# Patient Record
Sex: Female | Born: 1965
Health system: Southern US, Community
[De-identification: ages and names within clinical notes are randomized; demographics above are authoritative.]

## PROBLEM LIST (undated history)

## (undated) DIAGNOSIS — Z9221 Personal history of antineoplastic chemotherapy: Secondary | ICD-10-CM

## (undated) DIAGNOSIS — T66XXXA Radiation sickness, unspecified, initial encounter: Secondary | ICD-10-CM

## (undated) DIAGNOSIS — C50919 Malignant neoplasm of unspecified site of unspecified female breast: Principal | ICD-10-CM

## (undated) DIAGNOSIS — I10 Essential (primary) hypertension: Secondary | ICD-10-CM

## (undated) DIAGNOSIS — Z923 Personal history of irradiation: Secondary | ICD-10-CM

## (undated) DIAGNOSIS — C801 Malignant (primary) neoplasm, unspecified: Secondary | ICD-10-CM

## (undated) HISTORY — PX: LIPOMA EXCISION: SHX5283

## (undated) HISTORY — PX: BREAST BIOPSY: SHX20

## (undated) HISTORY — PX: DILATION AND CURETTAGE OF UTERUS: SHX78

## (undated) HISTORY — PX: PORTACATH PLACEMENT: SHX2246

## (undated) HISTORY — PX: OTHER SURGICAL HISTORY: SHX169

## (undated) HISTORY — DX: Malignant neoplasm of unspecified site of unspecified female breast: C50.919

## (undated) HISTORY — PX: MASTECTOMY PARTIAL / LUMPECTOMY: SUR851

## (undated) HISTORY — PX: MASTECTOMY: SHX3

## (undated) HISTORY — DX: Radiation sickness, unspecified, initial encounter: T66.XXXA

## (undated) HISTORY — DX: Malignant (primary) neoplasm, unspecified: C80.1

## (undated) HISTORY — PX: AXILLARY LYMPH NODE DISSECTION: SHX5229

## (undated) HISTORY — PX: LYMPH NODE DISSECTION: SHX5087

---

## 2008-04-05 ENCOUNTER — Ambulatory Visit (HOSPITAL_COMMUNITY): Admission: RE | Admit: 2008-04-05 | Discharge: 2008-04-05 | Payer: Self-pay | Admitting: Internal Medicine

## 2008-10-25 ENCOUNTER — Ambulatory Visit: Payer: Self-pay | Admitting: Orthopedic Surgery

## 2008-10-25 DIAGNOSIS — S93609A Unspecified sprain of unspecified foot, initial encounter: Secondary | ICD-10-CM | POA: Insufficient documentation

## 2008-10-26 ENCOUNTER — Encounter: Payer: Self-pay | Admitting: Orthopedic Surgery

## 2008-10-31 ENCOUNTER — Encounter: Admission: RE | Admit: 2008-10-31 | Discharge: 2008-10-31 | Payer: Self-pay | Admitting: Specialist

## 2008-11-09 ENCOUNTER — Ambulatory Visit: Payer: Self-pay | Admitting: Orthopedic Surgery

## 2009-04-13 ENCOUNTER — Ambulatory Visit (HOSPITAL_COMMUNITY): Admission: RE | Admit: 2009-04-13 | Discharge: 2009-04-13 | Payer: Self-pay | Admitting: Internal Medicine

## 2009-04-25 ENCOUNTER — Ambulatory Visit (HOSPITAL_COMMUNITY): Admission: RE | Admit: 2009-04-25 | Discharge: 2009-04-25 | Payer: Self-pay | Admitting: Internal Medicine

## 2009-05-25 ENCOUNTER — Encounter: Admission: RE | Admit: 2009-05-25 | Discharge: 2009-05-25 | Payer: Self-pay | Admitting: Obstetrics & Gynecology

## 2011-02-27 ENCOUNTER — Other Ambulatory Visit: Payer: Self-pay | Admitting: Internal Medicine

## 2011-02-27 DIAGNOSIS — N6324 Unspecified lump in the left breast, lower inner quadrant: Secondary | ICD-10-CM

## 2011-03-11 ENCOUNTER — Ambulatory Visit
Admission: RE | Admit: 2011-03-11 | Discharge: 2011-03-11 | Disposition: A | Discharge status: Home / Self Care | Payer: Self-pay | Source: Ambulatory Visit | Attending: Internal Medicine | Admitting: Internal Medicine

## 2011-03-11 ENCOUNTER — Other Ambulatory Visit: Payer: Self-pay | Admitting: Internal Medicine

## 2011-03-11 ENCOUNTER — Other Ambulatory Visit: Payer: Self-pay | Admitting: Diagnostic Radiology

## 2011-03-11 DIAGNOSIS — N6324 Unspecified lump in the left breast, lower inner quadrant: Secondary | ICD-10-CM

## 2011-03-11 DIAGNOSIS — N6320 Unspecified lump in the left breast, unspecified quadrant: Secondary | ICD-10-CM

## 2011-03-12 ENCOUNTER — Other Ambulatory Visit: Payer: Self-pay | Admitting: Internal Medicine

## 2011-03-12 DIAGNOSIS — C50912 Malignant neoplasm of unspecified site of left female breast: Secondary | ICD-10-CM

## 2011-03-15 ENCOUNTER — Ambulatory Visit
Admission: RE | Admit: 2011-03-15 | Discharge: 2011-03-15 | Disposition: A | Discharge status: Home / Self Care | Payer: BC Managed Care – PPO | Source: Ambulatory Visit | Attending: Internal Medicine | Admitting: Internal Medicine

## 2011-03-15 DIAGNOSIS — C50912 Malignant neoplasm of unspecified site of left female breast: Secondary | ICD-10-CM

## 2011-03-20 ENCOUNTER — Ambulatory Visit
Admission: RE | Admit: 2011-03-20 | Discharge: 2011-03-20 | Disposition: A | Discharge status: Home / Self Care | Payer: BC Managed Care – PPO | Source: Ambulatory Visit | Attending: General Surgery | Admitting: General Surgery

## 2011-03-20 ENCOUNTER — Encounter (HOSPITAL_BASED_OUTPATIENT_CLINIC_OR_DEPARTMENT_OTHER): Payer: BC Managed Care – PPO | Admitting: Oncology

## 2011-03-20 ENCOUNTER — Other Ambulatory Visit: Payer: Self-pay | Admitting: General Surgery

## 2011-03-20 ENCOUNTER — Other Ambulatory Visit: Payer: Self-pay | Admitting: Oncology

## 2011-03-20 DIAGNOSIS — N6320 Unspecified lump in the left breast, unspecified quadrant: Secondary | ICD-10-CM

## 2011-03-20 DIAGNOSIS — C773 Secondary and unspecified malignant neoplasm of axilla and upper limb lymph nodes: Secondary | ICD-10-CM

## 2011-03-20 DIAGNOSIS — I10 Essential (primary) hypertension: Secondary | ICD-10-CM

## 2011-03-20 DIAGNOSIS — C50019 Malignant neoplasm of nipple and areola, unspecified female breast: Secondary | ICD-10-CM

## 2011-03-20 DIAGNOSIS — F411 Generalized anxiety disorder: Secondary | ICD-10-CM

## 2011-03-20 DIAGNOSIS — C50919 Malignant neoplasm of unspecified site of unspecified female breast: Secondary | ICD-10-CM

## 2011-03-20 LAB — CANCER ANTIGEN 27.29: CA 27.29: 23 U/mL (ref 0–39)

## 2011-03-20 LAB — CBC WITH DIFFERENTIAL/PLATELET
BASO%: 0.7 % (ref 0.0–2.0)
EOS%: 1.7 % (ref 0.0–7.0)
Eosinophils Absolute: 0.1 10*3/uL (ref 0.0–0.5)
HCT: 41.6 % (ref 34.8–46.6)
HGB: 14.4 g/dL (ref 11.6–15.9)
MCH: 32.1 pg (ref 25.1–34.0)
MCV: 92.7 fL (ref 79.5–101.0)
NEUT%: 60.5 % (ref 38.4–76.8)
WBC: 5.3 10*3/uL (ref 3.9–10.3)
lymph#: 1.6 10*3/uL (ref 0.9–3.3)

## 2011-03-20 LAB — COMPREHENSIVE METABOLIC PANEL
Albumin: 4.5 g/dL (ref 3.5–5.2)
Alkaline Phosphatase: 49 U/L (ref 39–117)
Calcium: 9.2 mg/dL (ref 8.4–10.5)
Glucose, Bld: 98 mg/dL (ref 70–99)
Potassium: 3.7 mEq/L (ref 3.5–5.3)
Total Bilirubin: 0.7 mg/dL (ref 0.3–1.2)

## 2011-03-22 ENCOUNTER — Encounter: Payer: BC Managed Care – PPO | Admitting: Genetic Counselor

## 2011-03-26 ENCOUNTER — Other Ambulatory Visit (HOSPITAL_COMMUNITY): Payer: Self-pay | Admitting: General Surgery

## 2011-03-26 DIAGNOSIS — C50912 Malignant neoplasm of unspecified site of left female breast: Secondary | ICD-10-CM

## 2011-04-05 ENCOUNTER — Encounter (HOSPITAL_BASED_OUTPATIENT_CLINIC_OR_DEPARTMENT_OTHER)
Admission: RE | Admit: 2011-04-05 | Discharge: 2011-04-05 | Disposition: A | Discharge status: Home / Self Care | Payer: BC Managed Care – PPO | Source: Ambulatory Visit | Attending: General Surgery | Admitting: General Surgery

## 2011-04-05 ENCOUNTER — Ambulatory Visit
Admission: RE | Admit: 2011-04-05 | Discharge: 2011-04-05 | Disposition: A | Discharge status: Home / Self Care | Payer: BC Managed Care – PPO | Source: Ambulatory Visit | Attending: General Surgery | Admitting: General Surgery

## 2011-04-05 ENCOUNTER — Other Ambulatory Visit: Payer: Self-pay | Admitting: General Surgery

## 2011-04-05 DIAGNOSIS — Z01811 Encounter for preprocedural respiratory examination: Secondary | ICD-10-CM

## 2011-04-05 LAB — HEPATIC FUNCTION PANEL
ALT: 33 U/L (ref 0–35)
AST: 35 U/L (ref 0–37)
Albumin: 4.2 g/dL (ref 3.5–5.2)
Alkaline Phosphatase: 43 U/L (ref 39–117)
Indirect Bilirubin: 0.5 mg/dL (ref 0.3–0.9)
Total Protein: 7.1 g/dL (ref 6.0–8.3)

## 2011-04-05 LAB — BASIC METABOLIC PANEL
CO2: 31 mEq/L (ref 19–32)
Chloride: 102 mEq/L (ref 96–112)
Potassium: 4.3 mEq/L (ref 3.5–5.1)

## 2011-04-09 ENCOUNTER — Ambulatory Visit (HOSPITAL_COMMUNITY)
Admission: RE | Admit: 2011-04-09 | Discharge: 2011-04-09 | Disposition: A | Discharge status: Home / Self Care | Payer: BC Managed Care – PPO | Source: Ambulatory Visit | Attending: General Surgery | Admitting: General Surgery

## 2011-04-09 ENCOUNTER — Other Ambulatory Visit (HOSPITAL_COMMUNITY): Payer: BC Managed Care – PPO

## 2011-04-09 ENCOUNTER — Other Ambulatory Visit: Payer: Self-pay | Admitting: General Surgery

## 2011-04-09 ENCOUNTER — Ambulatory Visit (HOSPITAL_BASED_OUTPATIENT_CLINIC_OR_DEPARTMENT_OTHER)
Admission: RE | Admit: 2011-04-09 | Discharge: 2011-04-09 | Disposition: A | Discharge status: Home / Self Care | Payer: BC Managed Care – PPO | Source: Ambulatory Visit | Attending: General Surgery | Admitting: General Surgery

## 2011-04-09 DIAGNOSIS — I10 Essential (primary) hypertension: Secondary | ICD-10-CM | POA: Insufficient documentation

## 2011-04-09 DIAGNOSIS — F411 Generalized anxiety disorder: Secondary | ICD-10-CM | POA: Insufficient documentation

## 2011-04-09 DIAGNOSIS — C50119 Malignant neoplasm of central portion of unspecified female breast: Secondary | ICD-10-CM | POA: Insufficient documentation

## 2011-04-09 DIAGNOSIS — Z01818 Encounter for other preprocedural examination: Secondary | ICD-10-CM | POA: Insufficient documentation

## 2011-04-09 DIAGNOSIS — M549 Dorsalgia, unspecified: Secondary | ICD-10-CM | POA: Insufficient documentation

## 2011-04-09 DIAGNOSIS — N63 Unspecified lump in unspecified breast: Secondary | ICD-10-CM | POA: Insufficient documentation

## 2011-04-09 DIAGNOSIS — C773 Secondary and unspecified malignant neoplasm of axilla and upper limb lymph nodes: Secondary | ICD-10-CM | POA: Insufficient documentation

## 2011-04-09 DIAGNOSIS — C50912 Malignant neoplasm of unspecified site of left female breast: Secondary | ICD-10-CM

## 2011-04-09 DIAGNOSIS — D0590 Unspecified type of carcinoma in situ of unspecified breast: Secondary | ICD-10-CM | POA: Insufficient documentation

## 2011-04-09 DIAGNOSIS — Z170 Estrogen receptor positive status [ER+]: Secondary | ICD-10-CM | POA: Insufficient documentation

## 2011-04-09 DIAGNOSIS — C50919 Malignant neoplasm of unspecified site of unspecified female breast: Secondary | ICD-10-CM | POA: Insufficient documentation

## 2011-04-09 MED ORDER — TECHNETIUM TC 99M SULFUR COLLOID FILTERED
1.0000 | Freq: Once | INTRAVENOUS | Status: AC | PRN
  Administered 2011-04-09: 1 via INTRADERMAL

## 2011-04-11 NOTE — Op Note (Signed)
[name], [name]           ACCOUNT NO.:  [id number]  MEDICAL RECORD NO.:  [id number]           PATIENT TYPE:  O  LOCATION:  NUC                          FACILITY:  [facility]  PHYSICIAN:  Adolph Pollack, M.D.DATE OF BIRTH:  1966-03-12  DATE OF PROCEDURE: DATE OF DISCHARGE:                              OPERATIVE REPORT   PREOPERATIVE DIAGNOSES: 1. Left breast cancer. 2. Left breast mass.  POSTOPERATIVE DIAGNOSES: 1. Left breast cancer. 2. Left breast mass.  PROCEDURES: 1. Injection of blue dye in the left breast and axillary mapping. 2. Left axillary sentinel lymph node biopsy (4 lymph nodes). 3. Excision of left breast mass upper outer quadrant. 4. Left partial mastectomy (central).  SURGEON:  Adolph Pollack, MD  ANESTHESIA:  General.  INDICATIONS:  [name] is a 45 year old female who noted some left nipple inversion and thought she had a mass behind her nipple. This is an abnormal area which was confirmed mammographically and she underwent an image-guided biopsy showing invasive lobular carcinoma, ER/PR positive.  Bilateral breast MRI was performed which showed the enhancing mass measuring about 2.8 x 1.8 x 1.6 cm.  She also had a palpable mass in the upper outer quadrant that did not show up on ultrasound imaging.  She now presents for the above procedures.  We discussed the procedure risks and aftercare preoperatively.  TECHNIQUE:  She underwent a successful injection of radioactive material in the circumareolar area.  I then saw her in the holding area and marked the left breast mass and her left breast with my initials.  She was brought to the operating room, placed supine on the operating room table and general anesthetic was administered.  I then sterilely prepped the circumareolar area and injected blue dye at 12, 3, 6,  and 9 o'clock quadrants to massage the breast for 5 minutes.  The left breast and axilla were then sterilely prepped and  draped.  I then identified a point in the inferior axillary line which had increased counts by way of the NeoProbe.  A transverse incision was made in the inferior axillary line on the left side and subcutaneous tissue was divided with electrocautery.  Using a NeoProbe, I identified 2 lymph nodes that were directly adjacent to each other, both of which were slightly blue.  Both had significantly increased counts and these were excised and sent as sentinel lymph node 1 and 2.  The reduction of the NeoProbe back into the room demonstrated an area of increased counts and I was able to identify lymph node that was not blue but had increased counts and this was removed as sentinel node 3.  I then reintroduced the NeoProbe and in the subpectoral area (Rotter's area).  There was increased counts in the small palpable lymph which was removed as a sentinel lymph node #4.  No other hot spots were noted.  These were all sent to Pathology.  A lymph node that was not blue or hot was noted adjacent to the other lymph nodes and was removed and sent separately.  Following this, hemostasis was noted to be adequate.  I then closed subcutaneous tissue loosely with  interrupted 3-0 Vicryl sutures.  The skin was closed with 4-0 Monocryl subcuticular stitch.  I then made a small incision in the upper outer quadrant of the left breast and excised the left breast mass which appears to be mostly breast tissue and fatty tissue.  This was sent to Pathology.  The bleeding was controlled with electrocautery.  Subcutaneous tissue was closed with interrupted 3-0 Vicryl sutures and the skin closed with 4-0 Monocryl subcuticular stitch.  Next, I approached the central region and made an elliptical incision around the nipple and raised skin flaps in all directions.  I then took the incision directly into the chest wall, noticed inferiorly, there were some sclerotic areas, so I went a little bit more inferiorly with my  dissection from the skin flap down to the chest wall and extracted, then excised the central breast area off of the fascia of the pectoralis muscle with electrocautery.  I then oriented it with a single suture superiorly and a double suture medially.  This was sent to Pathology. Then irrigated this wound and controlled bleeding with electrocautery. I then mobilized the breast tissue free from the chest wall and loosely approximated to form a breast mound.  We entered with 3-0 Vicryl sutures.  I then reapproximated more superficial subcutaneous tissue with 3-0 Vicryl suture.  The skin was closed with 4-0 Monocryl subcuticular stitch.  Steri-Strips and sterile dressings were applied to all wounds.  She tolerated both procedures well without any apparent complications. She had a breast binder placed on and she was taken to the recovery room in satisfactory condition.     Adolph Pollack, M.D.     [name]  D:  04/09/2011  T:  04/10/2011  Job:  [id number]  cc:   Lurline Hare, M.D. Drue Second, M.D. Catalina Pizza, M.D.  Electronically Signed by Avel Peace M.D. on 04/11/2011 10:41:16 AM

## 2011-04-17 ENCOUNTER — Encounter: Payer: BC Managed Care – PPO | Admitting: Oncology

## 2011-04-19 ENCOUNTER — Other Ambulatory Visit: Payer: Self-pay | Admitting: General Surgery

## 2011-04-19 ENCOUNTER — Encounter (HOSPITAL_COMMUNITY): Payer: BC Managed Care – PPO

## 2011-04-19 DIAGNOSIS — C773 Secondary and unspecified malignant neoplasm of axilla and upper limb lymph nodes: Secondary | ICD-10-CM | POA: Insufficient documentation

## 2011-04-19 DIAGNOSIS — Z01812 Encounter for preprocedural laboratory examination: Secondary | ICD-10-CM | POA: Insufficient documentation

## 2011-04-19 DIAGNOSIS — C50919 Malignant neoplasm of unspecified site of unspecified female breast: Secondary | ICD-10-CM | POA: Insufficient documentation

## 2011-04-19 DIAGNOSIS — I10 Essential (primary) hypertension: Secondary | ICD-10-CM | POA: Insufficient documentation

## 2011-04-19 DIAGNOSIS — Z79899 Other long term (current) drug therapy: Secondary | ICD-10-CM | POA: Insufficient documentation

## 2011-04-19 LAB — SURGICAL PCR SCREEN: MRSA, PCR: NEGATIVE

## 2011-04-23 ENCOUNTER — Ambulatory Visit (HOSPITAL_COMMUNITY)
Admission: RE | Admit: 2011-04-23 | Discharge: 2011-04-24 | Disposition: A | Discharge status: Home / Self Care | Payer: BC Managed Care – PPO | Source: Ambulatory Visit | Attending: General Surgery | Admitting: General Surgery

## 2011-04-23 ENCOUNTER — Ambulatory Visit (HOSPITAL_COMMUNITY): Payer: BC Managed Care – PPO

## 2011-04-23 ENCOUNTER — Other Ambulatory Visit: Payer: Self-pay | Admitting: General Surgery

## 2011-04-23 DIAGNOSIS — C50919 Malignant neoplasm of unspecified site of unspecified female breast: Secondary | ICD-10-CM | POA: Insufficient documentation

## 2011-04-23 DIAGNOSIS — Z91040 Latex allergy status: Secondary | ICD-10-CM | POA: Insufficient documentation

## 2011-04-23 DIAGNOSIS — Z01812 Encounter for preprocedural laboratory examination: Secondary | ICD-10-CM | POA: Insufficient documentation

## 2011-04-23 DIAGNOSIS — Z01818 Encounter for other preprocedural examination: Secondary | ICD-10-CM | POA: Insufficient documentation

## 2011-04-23 DIAGNOSIS — C773 Secondary and unspecified malignant neoplasm of axilla and upper limb lymph nodes: Secondary | ICD-10-CM | POA: Insufficient documentation

## 2011-04-23 DIAGNOSIS — I10 Essential (primary) hypertension: Secondary | ICD-10-CM | POA: Insufficient documentation

## 2011-04-24 NOTE — Op Note (Signed)
[name], [name]           ACCOUNT NO.:  [id number]  MEDICAL RECORD NO.:  [id number]           PATIENT TYPE:  O  LOCATION:  1535                         FACILITY:  [facility]  PHYSICIAN:  Adolph Pollack, M.D.DATE OF BIRTH:  May 23, 1966  DATE OF PROCEDURE:  04/23/2011 DATE OF DISCHARGE:                              OPERATIVE REPORT   PREOPERATIVE DIAGNOSIS:  Left breast cancer, metastatic to left axillary sentinel lymph nodes.  POSTOPERATIVE DIAGNOSIS:  Left breast cancer, metastatic to left axillary sentinel lymph nodes.  PROCEDURE: 1. Left axillary lymph node dissection. 2. Ultrasound-guided Port-A-Cath insertion into right internal jugular     vein with fluoroscopy (8-[place]).  SURGEON:  Adolph Pollack, MD  ANESTHESIA:  General plus local.  INDICATION:  [name] is a 45 year old female who underwent a central lumpectomy and sentinel lymph node biopsy.  Two of the sentinel lymph nodes came back grossly positive as well as 3 being negative. Because of the type of tumor, her age, it was felt she would benefit from left axillary lymph node dissection and now she presents for that. Procedure risks and aftercare were explained to her and her husband.  TECHNIQUE:  She was brought to the operating room, placed supine on the operating table, and general anesthetic was administered.  A left lateral chest axillary area on arm up to the elbow was sterilely prepped and draped.  The previous incision was identified, the skin sutures removed, the subcutaneous sutures were removed, and allowed me to enter the axillary cavity.  Using blunt dissection, I identified the left axillary vein.  I then used blunt dissection to identify the thoracodorsal nerve and the long thoracic nerve.  I then dissected the lymphatic packet of tissue in between these 2 nerves using blunt dissection and a sharp dissection electrocautery.  I clipped and divided lymphatics and small  vessels.  I tested the nerves during the dissection and they remained intact and functional.  I then removed this tissue and then picked out some more of the tissue between these nerves and sent all this as left axillary content to Pathology.  A number of palpable lymph nodes were noted.  Nerve function was tested and was intact.  Following this, I injected Marcaine into the wound and inspected for hemostasis which was adequate.  I then made a stab incision inferior to the wound and brought a 19 Blake drain into the left axilla and anchored it to the skin with a nylon suture.  I then injected local anesthetic into the subdermal area.  The subcutaneous tissue was then closed with interrupted 3-0 Vicryl sutures.  The skin was closed with 4-0 Monocryl subcuticular stitch.  Needle, sponge, and instrument counts were correct.  Steri-Strips and sterile dressing were applied.  Following this, we then removed the drapes.  Using ultrasound, I mapped out the right internal jugular vein. The right neck and upper chest wall then sterilely prepped and draped.  Using the ultrasound, I identified the right internal jugular vein.  A small incision was made in the left neck area and the 16 gauge needle was introduced into the left internal jugular vein under  ultrasound guidance and blood aspirated easily.  I then threaded a wire through the needle, confirming its position also in the left internal jugular vein with ultrasound.  I then used fluoroscopy to confirm its position in the superior vena cava.  Following this, I then injected local anesthetic in the right upper chest wall and made an incision in the right upper chest wall through the skin and subcutaneous tissue.  Using electrocautery, I created a pocket for the port.  I then anesthetized a subcutaneous tunnel between the neck and the chest wall incisions and I tunneled the catheter from the chest wall incision up to the neck incision.  The  dilator introducer device was then placed over the needle into the superior vena cava and verified by fluoroscopy.  The dilator and the needle were removed and the catheter was threaded through the peel-away sheath introducer into the right heart.  The peel-away sheath and this was removed.  Under fluoroscopy, I then pulled back the catheter, the tip was in the distal superior vena cava.  I then cut the catheter and attached to the port at the chest wall incision site.  The port withdrew blood and flushed easily.  The port was then anchored to the chest wall with interrupted 2- 0 Vicryl suture.  Concentrated solution was placed in the port.  I then closed the skin incision of the neck with 4-0 Monocryl subcuticular stitch.  The port site wound in the chest was closed in 2 layers.  Subcutaneous tissue was approximated with running 3-0 Vicryl suture.  The skin was closed with 4-0 Monocryl subcuticular stitch. Steri-Strips and sterile dressings were then applied to this area.  She tolerated both procedures well without any apparent complications and was taken to the Recovery in satisfactory condition where a portable chest x-ray is pending.     Adolph Pollack, M.D.     [name]  D:  04/23/2011  T:  04/24/2011  Job:  [id number]  cc:   Catalina Pizza, M.D. Fax: [phone]  Drue Second, M.D. Fax: [phone]  Electronically Signed by Avel Peace M.D. on 04/24/2011 10:41:59 AM

## 2011-05-23 ENCOUNTER — Encounter (HOSPITAL_BASED_OUTPATIENT_CLINIC_OR_DEPARTMENT_OTHER): Payer: BC Managed Care – PPO | Admitting: Oncology

## 2011-05-23 ENCOUNTER — Other Ambulatory Visit: Payer: Self-pay | Admitting: Oncology

## 2011-05-23 DIAGNOSIS — C50119 Malignant neoplasm of central portion of unspecified female breast: Secondary | ICD-10-CM

## 2011-05-23 DIAGNOSIS — C773 Secondary and unspecified malignant neoplasm of axilla and upper limb lymph nodes: Secondary | ICD-10-CM

## 2011-05-23 DIAGNOSIS — C50019 Malignant neoplasm of nipple and areola, unspecified female breast: Secondary | ICD-10-CM

## 2011-05-23 DIAGNOSIS — C50919 Malignant neoplasm of unspecified site of unspecified female breast: Secondary | ICD-10-CM

## 2011-05-23 DIAGNOSIS — Z170 Estrogen receptor positive status [ER+]: Secondary | ICD-10-CM

## 2011-05-23 LAB — CBC WITH DIFFERENTIAL/PLATELET
Eosinophils Absolute: 0.1 10*3/uL (ref 0.0–0.5)
HCT: 39.4 % (ref 34.8–46.6)
HGB: 13.6 g/dL (ref 11.6–15.9)
LYMPH%: 36.1 % (ref 14.0–49.7)
MONO#: 0.3 10*3/uL (ref 0.1–0.9)
NEUT#: 2.9 10*3/uL (ref 1.5–6.5)
NEUT%: 55.1 % (ref 38.4–76.8)
Platelets: 295 10*3/uL (ref 145–400)
WBC: 5.2 10*3/uL (ref 3.9–10.3)
lymph#: 1.9 10*3/uL (ref 0.9–3.3)

## 2011-05-23 LAB — COMPREHENSIVE METABOLIC PANEL
ALT: 19 U/L (ref 0–35)
CO2: 28 mEq/L (ref 19–32)
Calcium: 9.8 mg/dL (ref 8.4–10.5)
Chloride: 98 mEq/L (ref 96–112)
Creatinine, Ser: 0.81 mg/dL (ref 0.50–1.10)
Glucose, Bld: 112 mg/dL — ABNORMAL HIGH (ref 70–99)
Total Bilirubin: 0.5 mg/dL (ref 0.3–1.2)
Total Protein: 7.5 g/dL (ref 6.0–8.3)

## 2011-05-30 ENCOUNTER — Ambulatory Visit (HOSPITAL_COMMUNITY)
Admission: RE | Admit: 2011-05-30 | Discharge: 2011-05-30 | Disposition: A | Discharge status: Home / Self Care | Payer: BC Managed Care – PPO | Source: Ambulatory Visit | Attending: Oncology | Admitting: Oncology

## 2011-05-30 DIAGNOSIS — Z01818 Encounter for other preprocedural examination: Secondary | ICD-10-CM | POA: Insufficient documentation

## 2011-05-30 DIAGNOSIS — Z5111 Encounter for antineoplastic chemotherapy: Secondary | ICD-10-CM

## 2011-05-30 DIAGNOSIS — C50919 Malignant neoplasm of unspecified site of unspecified female breast: Secondary | ICD-10-CM | POA: Insufficient documentation

## 2011-06-04 ENCOUNTER — Encounter (INDEPENDENT_AMBULATORY_CARE_PROVIDER_SITE_OTHER): Payer: Self-pay | Admitting: General Surgery

## 2011-06-04 ENCOUNTER — Other Ambulatory Visit: Payer: Self-pay | Admitting: Oncology

## 2011-06-04 ENCOUNTER — Encounter (HOSPITAL_BASED_OUTPATIENT_CLINIC_OR_DEPARTMENT_OTHER): Payer: BC Managed Care – PPO | Admitting: Oncology

## 2011-06-04 DIAGNOSIS — C50919 Malignant neoplasm of unspecified site of unspecified female breast: Secondary | ICD-10-CM

## 2011-06-04 DIAGNOSIS — C50119 Malignant neoplasm of central portion of unspecified female breast: Secondary | ICD-10-CM

## 2011-06-04 DIAGNOSIS — Z5111 Encounter for antineoplastic chemotherapy: Secondary | ICD-10-CM

## 2011-06-04 DIAGNOSIS — C50019 Malignant neoplasm of nipple and areola, unspecified female breast: Secondary | ICD-10-CM

## 2011-06-04 DIAGNOSIS — C773 Secondary and unspecified malignant neoplasm of axilla and upper limb lymph nodes: Secondary | ICD-10-CM

## 2011-06-04 LAB — CBC WITH DIFFERENTIAL/PLATELET
BASO%: 0.8 % (ref 0.0–2.0)
Eosinophils Absolute: 0.1 10*3/uL (ref 0.0–0.5)
HCT: 38.2 % (ref 34.8–46.6)
LYMPH%: 26.1 % (ref 14.0–49.7)
MCHC: 34 g/dL (ref 31.5–36.0)
MONO#: 0.5 10*3/uL (ref 0.1–0.9)
NEUT#: 4.1 10*3/uL (ref 1.5–6.5)
Platelets: 253 10*3/uL (ref 145–400)
RBC: 4.17 10*6/uL (ref 3.70–5.45)
WBC: 6.6 10*3/uL (ref 3.9–10.3)
lymph#: 1.7 10*3/uL (ref 0.9–3.3)

## 2011-06-04 LAB — COMPREHENSIVE METABOLIC PANEL
ALT: 12 U/L (ref 0–35)
Albumin: 4.4 g/dL (ref 3.5–5.2)
CO2: 27 mEq/L (ref 19–32)
Calcium: 9.8 mg/dL (ref 8.4–10.5)
Chloride: 101 mEq/L (ref 96–112)
Glucose, Bld: 95 mg/dL (ref 70–99)
Sodium: 136 mEq/L (ref 135–145)
Total Protein: 6.9 g/dL (ref 6.0–8.3)

## 2011-06-05 ENCOUNTER — Encounter (HOSPITAL_BASED_OUTPATIENT_CLINIC_OR_DEPARTMENT_OTHER): Payer: BC Managed Care – PPO | Admitting: Oncology

## 2011-06-05 DIAGNOSIS — C50119 Malignant neoplasm of central portion of unspecified female breast: Secondary | ICD-10-CM

## 2011-06-05 DIAGNOSIS — C50019 Malignant neoplasm of nipple and areola, unspecified female breast: Secondary | ICD-10-CM

## 2011-06-05 DIAGNOSIS — C773 Secondary and unspecified malignant neoplasm of axilla and upper limb lymph nodes: Secondary | ICD-10-CM

## 2011-06-05 DIAGNOSIS — Z5111 Encounter for antineoplastic chemotherapy: Secondary | ICD-10-CM

## 2011-06-11 ENCOUNTER — Other Ambulatory Visit: Payer: Self-pay | Admitting: Oncology

## 2011-06-11 ENCOUNTER — Encounter (HOSPITAL_BASED_OUTPATIENT_CLINIC_OR_DEPARTMENT_OTHER): Payer: BC Managed Care – PPO | Admitting: Oncology

## 2011-06-11 DIAGNOSIS — C50119 Malignant neoplasm of central portion of unspecified female breast: Secondary | ICD-10-CM

## 2011-06-11 DIAGNOSIS — C50019 Malignant neoplasm of nipple and areola, unspecified female breast: Secondary | ICD-10-CM

## 2011-06-11 DIAGNOSIS — C773 Secondary and unspecified malignant neoplasm of axilla and upper limb lymph nodes: Secondary | ICD-10-CM

## 2011-06-11 DIAGNOSIS — C50919 Malignant neoplasm of unspecified site of unspecified female breast: Secondary | ICD-10-CM

## 2011-06-11 LAB — CBC WITH DIFFERENTIAL/PLATELET
BASO%: 1 % (ref 0.0–2.0)
Eosinophils Absolute: 0.2 10*3/uL (ref 0.0–0.5)
LYMPH%: 64.6 % — ABNORMAL HIGH (ref 14.0–49.7)
MCHC: 34.9 g/dL (ref 31.5–36.0)
MONO#: 0.1 10*3/uL (ref 0.1–0.9)
NEUT#: 0.4 10*3/uL — CL (ref 1.5–6.5)
RBC: 4.05 10*6/uL (ref 3.70–5.45)
RDW: 12.8 % (ref 11.2–14.5)
WBC: 1.9 10*3/uL — ABNORMAL LOW (ref 3.9–10.3)
lymph#: 1.2 10*3/uL (ref 0.9–3.3)

## 2011-06-11 LAB — BASIC METABOLIC PANEL
CO2: 31 mEq/L (ref 19–32)
Calcium: 9.3 mg/dL (ref 8.4–10.5)
Potassium: 3.5 mEq/L (ref 3.5–5.3)
Sodium: 135 mEq/L (ref 135–145)

## 2011-06-18 ENCOUNTER — Other Ambulatory Visit: Payer: Self-pay | Admitting: Oncology

## 2011-06-18 ENCOUNTER — Encounter (HOSPITAL_BASED_OUTPATIENT_CLINIC_OR_DEPARTMENT_OTHER): Payer: BC Managed Care – PPO | Admitting: Oncology

## 2011-06-18 DIAGNOSIS — C50019 Malignant neoplasm of nipple and areola, unspecified female breast: Secondary | ICD-10-CM

## 2011-06-18 DIAGNOSIS — C50919 Malignant neoplasm of unspecified site of unspecified female breast: Secondary | ICD-10-CM

## 2011-06-18 DIAGNOSIS — Z5111 Encounter for antineoplastic chemotherapy: Secondary | ICD-10-CM

## 2011-06-18 DIAGNOSIS — C773 Secondary and unspecified malignant neoplasm of axilla and upper limb lymph nodes: Secondary | ICD-10-CM

## 2011-06-18 DIAGNOSIS — C50119 Malignant neoplasm of central portion of unspecified female breast: Secondary | ICD-10-CM

## 2011-06-18 LAB — CBC WITH DIFFERENTIAL/PLATELET
Basophils Absolute: 0.1 10*3/uL (ref 0.0–0.1)
EOS%: 0.4 % (ref 0.0–7.0)
HCT: 37.9 % (ref 34.8–46.6)
HGB: 12.9 g/dL (ref 11.6–15.9)
LYMPH%: 25.6 % (ref 14.0–49.7)
MCH: 31.2 pg (ref 25.1–34.0)
MONO#: 0.8 10*3/uL (ref 0.1–0.9)
NEUT%: 66.2 % (ref 38.4–76.8)
Platelets: 251 10*3/uL (ref 145–400)
lymph#: 3 10*3/uL (ref 0.9–3.3)

## 2011-06-18 LAB — COMPREHENSIVE METABOLIC PANEL
BUN: 10 mg/dL (ref 6–23)
CO2: 27 mEq/L (ref 19–32)
Calcium: 9.5 mg/dL (ref 8.4–10.5)
Chloride: 100 mEq/L (ref 96–112)
Creatinine, Ser: 1.02 mg/dL (ref 0.50–1.10)

## 2011-06-19 ENCOUNTER — Encounter (HOSPITAL_BASED_OUTPATIENT_CLINIC_OR_DEPARTMENT_OTHER): Payer: BC Managed Care – PPO | Admitting: Oncology

## 2011-06-19 DIAGNOSIS — Z5111 Encounter for antineoplastic chemotherapy: Secondary | ICD-10-CM

## 2011-06-19 DIAGNOSIS — C50119 Malignant neoplasm of central portion of unspecified female breast: Secondary | ICD-10-CM

## 2011-06-19 DIAGNOSIS — R509 Fever, unspecified: Secondary | ICD-10-CM

## 2011-06-19 DIAGNOSIS — D702 Other drug-induced agranulocytosis: Secondary | ICD-10-CM

## 2011-06-25 ENCOUNTER — Encounter (HOSPITAL_BASED_OUTPATIENT_CLINIC_OR_DEPARTMENT_OTHER): Payer: BC Managed Care – PPO | Admitting: Oncology

## 2011-06-25 ENCOUNTER — Other Ambulatory Visit: Payer: Self-pay | Admitting: Oncology

## 2011-06-25 DIAGNOSIS — C773 Secondary and unspecified malignant neoplasm of axilla and upper limb lymph nodes: Secondary | ICD-10-CM

## 2011-06-25 DIAGNOSIS — C50119 Malignant neoplasm of central portion of unspecified female breast: Secondary | ICD-10-CM

## 2011-06-25 DIAGNOSIS — C50919 Malignant neoplasm of unspecified site of unspecified female breast: Secondary | ICD-10-CM

## 2011-06-25 DIAGNOSIS — C50019 Malignant neoplasm of nipple and areola, unspecified female breast: Secondary | ICD-10-CM

## 2011-06-25 LAB — BASIC METABOLIC PANEL
BUN: 10 mg/dL (ref 6–23)
CO2: 28 mEq/L (ref 19–32)
Chloride: 100 mEq/L (ref 96–112)
Creatinine, Ser: 0.64 mg/dL (ref 0.50–1.10)
Glucose, Bld: 106 mg/dL — ABNORMAL HIGH (ref 70–99)

## 2011-06-25 LAB — CBC WITH DIFFERENTIAL/PLATELET
Basophils Absolute: 0 10*3/uL (ref 0.0–0.1)
Eosinophils Absolute: 0 10*3/uL (ref 0.0–0.5)
HCT: 32.7 % — ABNORMAL LOW (ref 34.8–46.6)
HGB: 11.4 g/dL — ABNORMAL LOW (ref 11.6–15.9)
MONO#: 0.1 10*3/uL (ref 0.1–0.9)
NEUT%: 45.5 % (ref 38.4–76.8)
WBC: 1.3 10*3/uL — ABNORMAL LOW (ref 3.9–10.3)
lymph#: 0.6 10*3/uL — ABNORMAL LOW (ref 0.9–3.3)

## 2011-07-02 ENCOUNTER — Other Ambulatory Visit: Payer: Self-pay | Admitting: Oncology

## 2011-07-02 ENCOUNTER — Encounter (HOSPITAL_BASED_OUTPATIENT_CLINIC_OR_DEPARTMENT_OTHER): Payer: BC Managed Care – PPO | Admitting: Oncology

## 2011-07-02 DIAGNOSIS — C773 Secondary and unspecified malignant neoplasm of axilla and upper limb lymph nodes: Secondary | ICD-10-CM

## 2011-07-02 DIAGNOSIS — C50119 Malignant neoplasm of central portion of unspecified female breast: Secondary | ICD-10-CM

## 2011-07-02 DIAGNOSIS — Z170 Estrogen receptor positive status [ER+]: Secondary | ICD-10-CM

## 2011-07-02 DIAGNOSIS — C50019 Malignant neoplasm of nipple and areola, unspecified female breast: Secondary | ICD-10-CM

## 2011-07-02 DIAGNOSIS — C50919 Malignant neoplasm of unspecified site of unspecified female breast: Secondary | ICD-10-CM

## 2011-07-02 LAB — COMPREHENSIVE METABOLIC PANEL
Alkaline Phosphatase: 73 U/L (ref 39–117)
BUN: 9 mg/dL (ref 6–23)
Glucose, Bld: 104 mg/dL — ABNORMAL HIGH (ref 70–99)
Sodium: 137 mEq/L (ref 135–145)
Total Bilirubin: 0.3 mg/dL (ref 0.3–1.2)
Total Protein: 6.9 g/dL (ref 6.0–8.3)

## 2011-07-02 LAB — CBC WITH DIFFERENTIAL/PLATELET
Basophils Absolute: 0.1 10*3/uL (ref 0.0–0.1)
EOS%: 0.3 % (ref 0.0–7.0)
HGB: 12.4 g/dL (ref 11.6–15.9)
MCH: 31.2 pg (ref 25.1–34.0)
MCV: 91 fL (ref 79.5–101.0)
MONO%: 10.2 % (ref 0.0–14.0)
RBC: 3.98 10*6/uL (ref 3.70–5.45)
RDW: 13.2 % (ref 11.2–14.5)

## 2011-07-03 DIAGNOSIS — C50919 Malignant neoplasm of unspecified site of unspecified female breast: Secondary | ICD-10-CM

## 2011-07-03 DIAGNOSIS — D709 Neutropenia, unspecified: Secondary | ICD-10-CM

## 2011-07-16 ENCOUNTER — Encounter (HOSPITAL_BASED_OUTPATIENT_CLINIC_OR_DEPARTMENT_OTHER): Payer: BC Managed Care – PPO | Admitting: Oncology

## 2011-07-16 ENCOUNTER — Other Ambulatory Visit: Payer: Self-pay | Admitting: Oncology

## 2011-07-16 DIAGNOSIS — C50019 Malignant neoplasm of nipple and areola, unspecified female breast: Secondary | ICD-10-CM

## 2011-07-16 DIAGNOSIS — C50919 Malignant neoplasm of unspecified site of unspecified female breast: Secondary | ICD-10-CM

## 2011-07-16 DIAGNOSIS — Z5111 Encounter for antineoplastic chemotherapy: Secondary | ICD-10-CM

## 2011-07-16 DIAGNOSIS — C50119 Malignant neoplasm of central portion of unspecified female breast: Secondary | ICD-10-CM

## 2011-07-16 DIAGNOSIS — C50419 Malignant neoplasm of upper-outer quadrant of unspecified female breast: Secondary | ICD-10-CM

## 2011-07-16 DIAGNOSIS — C773 Secondary and unspecified malignant neoplasm of axilla and upper limb lymph nodes: Secondary | ICD-10-CM

## 2011-07-16 DIAGNOSIS — Z170 Estrogen receptor positive status [ER+]: Secondary | ICD-10-CM

## 2011-07-16 LAB — CBC WITH DIFFERENTIAL/PLATELET
Basophils Absolute: 0.1 10*3/uL (ref 0.0–0.1)
Eosinophils Absolute: 0 10*3/uL (ref 0.0–0.5)
HCT: 34.4 % — ABNORMAL LOW (ref 34.8–46.6)
HGB: 11.6 g/dL (ref 11.6–15.9)
LYMPH%: 15.7 % (ref 14.0–49.7)
MCV: 92.5 fL (ref 79.5–101.0)
MONO%: 10.7 % (ref 0.0–14.0)
NEUT#: 6.7 10*3/uL — ABNORMAL HIGH (ref 1.5–6.5)
NEUT%: 72.7 % (ref 38.4–76.8)
Platelets: 148 10*3/uL (ref 145–400)

## 2011-07-16 LAB — COMPREHENSIVE METABOLIC PANEL
CO2: 23 mEq/L (ref 19–32)
Calcium: 9.3 mg/dL (ref 8.4–10.5)
Creatinine, Ser: 0.68 mg/dL (ref 0.50–1.10)
Glucose, Bld: 89 mg/dL (ref 70–99)
Total Bilirubin: 0.3 mg/dL (ref 0.3–1.2)

## 2011-07-17 ENCOUNTER — Encounter (HOSPITAL_BASED_OUTPATIENT_CLINIC_OR_DEPARTMENT_OTHER): Payer: BC Managed Care – PPO | Admitting: Oncology

## 2011-07-17 DIAGNOSIS — Z5189 Encounter for other specified aftercare: Secondary | ICD-10-CM

## 2011-07-17 DIAGNOSIS — C50919 Malignant neoplasm of unspecified site of unspecified female breast: Secondary | ICD-10-CM

## 2011-07-17 DIAGNOSIS — C773 Secondary and unspecified malignant neoplasm of axilla and upper limb lymph nodes: Secondary | ICD-10-CM

## 2011-07-30 ENCOUNTER — Other Ambulatory Visit: Payer: Self-pay | Admitting: Oncology

## 2011-07-30 ENCOUNTER — Encounter (HOSPITAL_BASED_OUTPATIENT_CLINIC_OR_DEPARTMENT_OTHER): Payer: BC Managed Care – PPO | Admitting: Oncology

## 2011-07-30 DIAGNOSIS — Z5111 Encounter for antineoplastic chemotherapy: Secondary | ICD-10-CM

## 2011-07-30 DIAGNOSIS — C773 Secondary and unspecified malignant neoplasm of axilla and upper limb lymph nodes: Secondary | ICD-10-CM

## 2011-07-30 DIAGNOSIS — C50119 Malignant neoplasm of central portion of unspecified female breast: Secondary | ICD-10-CM

## 2011-07-30 DIAGNOSIS — C50019 Malignant neoplasm of nipple and areola, unspecified female breast: Secondary | ICD-10-CM

## 2011-07-30 DIAGNOSIS — C50919 Malignant neoplasm of unspecified site of unspecified female breast: Secondary | ICD-10-CM

## 2011-07-30 DIAGNOSIS — Z170 Estrogen receptor positive status [ER+]: Secondary | ICD-10-CM

## 2011-07-30 LAB — CBC WITH DIFFERENTIAL/PLATELET
BASO%: 1.4 % (ref 0.0–2.0)
Eosinophils Absolute: 0 10*3/uL (ref 0.0–0.5)
HCT: 32.9 % — ABNORMAL LOW (ref 34.8–46.6)
LYMPH%: 18.7 % (ref 14.0–49.7)
MCHC: 33.4 g/dL (ref 31.5–36.0)
MONO#: 0.7 10*3/uL (ref 0.1–0.9)
NEUT%: 70.1 % (ref 38.4–76.8)
Platelets: 176 10*3/uL (ref 145–400)
WBC: 6.9 10*3/uL (ref 3.9–10.3)

## 2011-07-30 LAB — COMPREHENSIVE METABOLIC PANEL
CO2: 25 mEq/L (ref 19–32)
Creatinine, Ser: 0.65 mg/dL (ref 0.50–1.10)
Glucose, Bld: 85 mg/dL (ref 70–99)
Total Bilirubin: 0.4 mg/dL (ref 0.3–1.2)

## 2011-08-09 ENCOUNTER — Encounter (HOSPITAL_BASED_OUTPATIENT_CLINIC_OR_DEPARTMENT_OTHER): Payer: BC Managed Care – PPO | Admitting: Oncology

## 2011-08-09 ENCOUNTER — Other Ambulatory Visit: Payer: Self-pay | Admitting: Oncology

## 2011-08-09 DIAGNOSIS — Z5111 Encounter for antineoplastic chemotherapy: Secondary | ICD-10-CM

## 2011-08-09 DIAGNOSIS — Z170 Estrogen receptor positive status [ER+]: Secondary | ICD-10-CM

## 2011-08-09 DIAGNOSIS — C773 Secondary and unspecified malignant neoplasm of axilla and upper limb lymph nodes: Secondary | ICD-10-CM

## 2011-08-09 DIAGNOSIS — C50919 Malignant neoplasm of unspecified site of unspecified female breast: Secondary | ICD-10-CM

## 2011-08-09 LAB — CBC WITH DIFFERENTIAL/PLATELET
Eosinophils Absolute: 0 10*3/uL (ref 0.0–0.5)
HCT: 29.6 % — ABNORMAL LOW (ref 34.8–46.6)
HGB: 10 g/dL — ABNORMAL LOW (ref 11.6–15.9)
LYMPH%: 15.9 % (ref 14.0–49.7)
MONO#: 0.6 10*3/uL (ref 0.1–0.9)
NEUT#: 4.1 10*3/uL (ref 1.5–6.5)
NEUT%: 71.9 % (ref 38.4–76.8)
Platelets: 338 10*3/uL (ref 145–400)
WBC: 5.7 10*3/uL (ref 3.9–10.3)

## 2011-08-09 LAB — COMPREHENSIVE METABOLIC PANEL
ALT: 21 U/L (ref 0–35)
Albumin: 3.8 g/dL (ref 3.5–5.2)
BUN: 8 mg/dL (ref 6–23)
CO2: 29 mEq/L (ref 19–32)
Calcium: 9.6 mg/dL (ref 8.4–10.5)
Chloride: 101 mEq/L (ref 96–112)
Creatinine, Ser: 0.62 mg/dL (ref 0.50–1.10)

## 2011-08-16 ENCOUNTER — Other Ambulatory Visit: Payer: Self-pay | Admitting: Oncology

## 2011-08-16 ENCOUNTER — Encounter (HOSPITAL_BASED_OUTPATIENT_CLINIC_OR_DEPARTMENT_OTHER): Payer: BC Managed Care – PPO | Admitting: Oncology

## 2011-08-16 DIAGNOSIS — C773 Secondary and unspecified malignant neoplasm of axilla and upper limb lymph nodes: Secondary | ICD-10-CM

## 2011-08-16 DIAGNOSIS — Z5111 Encounter for antineoplastic chemotherapy: Secondary | ICD-10-CM

## 2011-08-16 DIAGNOSIS — C50019 Malignant neoplasm of nipple and areola, unspecified female breast: Secondary | ICD-10-CM

## 2011-08-16 DIAGNOSIS — Z170 Estrogen receptor positive status [ER+]: Secondary | ICD-10-CM

## 2011-08-16 DIAGNOSIS — C50919 Malignant neoplasm of unspecified site of unspecified female breast: Secondary | ICD-10-CM

## 2011-08-16 DIAGNOSIS — R51 Headache: Secondary | ICD-10-CM

## 2011-08-16 LAB — COMPREHENSIVE METABOLIC PANEL
AST: 17 U/L (ref 0–37)
Albumin: 4.8 g/dL (ref 3.5–5.2)
BUN: 12 mg/dL (ref 6–23)
CO2: 27 mEq/L (ref 19–32)
Calcium: 9.4 mg/dL (ref 8.4–10.5)
Chloride: 100 mEq/L (ref 96–112)
Glucose, Bld: 100 mg/dL — ABNORMAL HIGH (ref 70–99)
Potassium: 3.6 mEq/L (ref 3.5–5.3)

## 2011-08-16 LAB — CBC WITH DIFFERENTIAL/PLATELET
BASO%: 2.2 % — ABNORMAL HIGH (ref 0.0–2.0)
HCT: 31.3 % — ABNORMAL LOW (ref 34.8–46.6)
MCHC: 34.2 g/dL (ref 31.5–36.0)
MONO#: 0.3 10*3/uL (ref 0.1–0.9)
NEUT%: 64.9 % (ref 38.4–76.8)
RDW: 14.6 % — ABNORMAL HIGH (ref 11.2–14.5)
WBC: 5 10*3/uL (ref 3.9–10.3)
lymph#: 1.2 10*3/uL (ref 0.9–3.3)

## 2011-08-21 ENCOUNTER — Ambulatory Visit (HOSPITAL_COMMUNITY)
Admission: RE | Admit: 2011-08-21 | Discharge: 2011-08-21 | Disposition: A | Discharge status: Home / Self Care | Payer: BC Managed Care – PPO | Source: Ambulatory Visit | Attending: Oncology | Admitting: Oncology

## 2011-08-21 ENCOUNTER — Encounter (HOSPITAL_COMMUNITY): Payer: Self-pay

## 2011-08-21 DIAGNOSIS — C50919 Malignant neoplasm of unspecified site of unspecified female breast: Secondary | ICD-10-CM | POA: Insufficient documentation

## 2011-08-21 DIAGNOSIS — J984 Other disorders of lung: Secondary | ICD-10-CM | POA: Insufficient documentation

## 2011-08-21 HISTORY — DX: Malignant neoplasm of unspecified site of unspecified female breast: C50.919

## 2011-08-21 HISTORY — DX: Essential (primary) hypertension: I10

## 2011-08-21 MED ORDER — IOHEXOL 300 MG/ML  SOLN
100.0000 mL | Freq: Once | INTRAMUSCULAR | Status: AC | PRN
  Administered 2011-08-21: 100 mL via INTRAVENOUS

## 2011-08-22 ENCOUNTER — Other Ambulatory Visit: Payer: Self-pay | Admitting: Oncology

## 2011-08-22 ENCOUNTER — Encounter (HOSPITAL_BASED_OUTPATIENT_CLINIC_OR_DEPARTMENT_OTHER): Payer: BC Managed Care – PPO | Admitting: Oncology

## 2011-08-22 DIAGNOSIS — C50919 Malignant neoplasm of unspecified site of unspecified female breast: Secondary | ICD-10-CM

## 2011-08-22 DIAGNOSIS — Z170 Estrogen receptor positive status [ER+]: Secondary | ICD-10-CM

## 2011-08-22 DIAGNOSIS — Z5111 Encounter for antineoplastic chemotherapy: Secondary | ICD-10-CM

## 2011-08-22 DIAGNOSIS — C773 Secondary and unspecified malignant neoplasm of axilla and upper limb lymph nodes: Secondary | ICD-10-CM

## 2011-08-22 LAB — COMPREHENSIVE METABOLIC PANEL
ALT: 27 U/L (ref 0–35)
AST: 23 U/L (ref 0–37)
Albumin: 4.3 g/dL (ref 3.5–5.2)
BUN: 11 mg/dL (ref 6–23)
CO2: 27 mEq/L (ref 19–32)
Calcium: 9.4 mg/dL (ref 8.4–10.5)
Chloride: 103 mEq/L (ref 96–112)
Creatinine, Ser: 0.74 mg/dL (ref 0.50–1.10)
Potassium: 4 mEq/L (ref 3.5–5.3)

## 2011-08-22 LAB — CBC WITH DIFFERENTIAL/PLATELET
BASO%: 0.6 % (ref 0.0–2.0)
Basophils Absolute: 0 10*3/uL (ref 0.0–0.1)
HCT: 27.9 % — ABNORMAL LOW (ref 34.8–46.6)
HGB: 9.8 g/dL — ABNORMAL LOW (ref 11.6–15.9)
MONO#: 0.2 10*3/uL (ref 0.1–0.9)
NEUT#: 4.2 10*3/uL (ref 1.5–6.5)
NEUT%: 80.1 % — ABNORMAL HIGH (ref 38.4–76.8)
RDW: 16.7 % — ABNORMAL HIGH (ref 11.2–14.5)
WBC: 5.3 10*3/uL (ref 3.9–10.3)
lymph#: 0.8 10*3/uL — ABNORMAL LOW (ref 0.9–3.3)

## 2011-08-23 ENCOUNTER — Encounter (HOSPITAL_BASED_OUTPATIENT_CLINIC_OR_DEPARTMENT_OTHER): Payer: BC Managed Care – PPO | Admitting: Oncology

## 2011-08-23 DIAGNOSIS — Z170 Estrogen receptor positive status [ER+]: Secondary | ICD-10-CM

## 2011-08-23 DIAGNOSIS — C773 Secondary and unspecified malignant neoplasm of axilla and upper limb lymph nodes: Secondary | ICD-10-CM

## 2011-08-23 DIAGNOSIS — C50019 Malignant neoplasm of nipple and areola, unspecified female breast: Secondary | ICD-10-CM

## 2011-08-23 DIAGNOSIS — Z5111 Encounter for antineoplastic chemotherapy: Secondary | ICD-10-CM

## 2011-08-30 ENCOUNTER — Other Ambulatory Visit: Payer: Self-pay | Admitting: Oncology

## 2011-08-30 ENCOUNTER — Encounter (HOSPITAL_BASED_OUTPATIENT_CLINIC_OR_DEPARTMENT_OTHER): Payer: BC Managed Care – PPO | Admitting: Oncology

## 2011-08-30 DIAGNOSIS — Z5111 Encounter for antineoplastic chemotherapy: Secondary | ICD-10-CM

## 2011-08-30 DIAGNOSIS — Z170 Estrogen receptor positive status [ER+]: Secondary | ICD-10-CM

## 2011-08-30 DIAGNOSIS — C773 Secondary and unspecified malignant neoplasm of axilla and upper limb lymph nodes: Secondary | ICD-10-CM

## 2011-08-30 DIAGNOSIS — C50919 Malignant neoplasm of unspecified site of unspecified female breast: Secondary | ICD-10-CM

## 2011-08-30 DIAGNOSIS — C50019 Malignant neoplasm of nipple and areola, unspecified female breast: Secondary | ICD-10-CM

## 2011-08-30 LAB — COMPREHENSIVE METABOLIC PANEL
ALT: 23 U/L (ref 0–35)
AST: 18 U/L (ref 0–37)
Albumin: 4.4 g/dL (ref 3.5–5.2)
Alkaline Phosphatase: 53 U/L (ref 39–117)
BUN: 10 mg/dL (ref 6–23)
Calcium: 9.6 mg/dL (ref 8.4–10.5)
Chloride: 101 mEq/L (ref 96–112)
Potassium: 3.6 mEq/L (ref 3.5–5.3)
Sodium: 138 mEq/L (ref 135–145)
Total Protein: 6.6 g/dL (ref 6.0–8.3)

## 2011-08-30 LAB — FERRITIN: Ferritin: 181 ng/mL (ref 10–291)

## 2011-08-30 LAB — CBC WITH DIFFERENTIAL/PLATELET
BASO%: 2.2 % — ABNORMAL HIGH (ref 0.0–2.0)
EOS%: 3.1 % (ref 0.0–7.0)
HCT: 28.4 % — ABNORMAL LOW (ref 34.8–46.6)
MCH: 31.8 pg (ref 25.1–34.0)
MCHC: 33.8 g/dL (ref 31.5–36.0)
MONO#: 0.5 10*3/uL (ref 0.1–0.9)
NEUT%: 60.7 % (ref 38.4–76.8)
RDW: 15.5 % — ABNORMAL HIGH (ref 11.2–14.5)
WBC: 4.5 10*3/uL (ref 3.9–10.3)
lymph#: 1.1 10*3/uL (ref 0.9–3.3)

## 2011-08-30 LAB — TECHNOLOGIST REVIEW

## 2011-08-30 LAB — IRON AND TIBC: UIBC: 334 ug/dL (ref 125–400)

## 2011-09-06 ENCOUNTER — Encounter (HOSPITAL_BASED_OUTPATIENT_CLINIC_OR_DEPARTMENT_OTHER): Payer: BC Managed Care – PPO | Admitting: Oncology

## 2011-09-06 ENCOUNTER — Other Ambulatory Visit: Payer: Self-pay | Admitting: Oncology

## 2011-09-06 DIAGNOSIS — R51 Headache: Secondary | ICD-10-CM

## 2011-09-06 DIAGNOSIS — Z170 Estrogen receptor positive status [ER+]: Secondary | ICD-10-CM

## 2011-09-06 DIAGNOSIS — C50919 Malignant neoplasm of unspecified site of unspecified female breast: Secondary | ICD-10-CM

## 2011-09-06 DIAGNOSIS — C773 Secondary and unspecified malignant neoplasm of axilla and upper limb lymph nodes: Secondary | ICD-10-CM

## 2011-09-06 DIAGNOSIS — C50019 Malignant neoplasm of nipple and areola, unspecified female breast: Secondary | ICD-10-CM

## 2011-09-06 DIAGNOSIS — Z5111 Encounter for antineoplastic chemotherapy: Secondary | ICD-10-CM

## 2011-09-06 LAB — CBC WITH DIFFERENTIAL/PLATELET
Basophils Absolute: 0.1 10*3/uL (ref 0.0–0.1)
Eosinophils Absolute: 0.1 10*3/uL (ref 0.0–0.5)
HGB: 9.4 g/dL — ABNORMAL LOW (ref 11.6–15.9)
LYMPH%: 33.2 % (ref 14.0–49.7)
MCV: 95.5 fL (ref 79.5–101.0)
MONO%: 9.9 % (ref 0.0–14.0)
NEUT#: 2.5 10*3/uL (ref 1.5–6.5)
Platelets: 302 10*3/uL (ref 145–400)

## 2011-09-06 LAB — BASIC METABOLIC PANEL
BUN: 11 mg/dL (ref 6–23)
CO2: 26 mEq/L (ref 19–32)
Chloride: 100 mEq/L (ref 96–112)
Creatinine, Ser: 0.74 mg/dL (ref 0.50–1.10)
Glucose, Bld: 119 mg/dL — ABNORMAL HIGH (ref 70–99)

## 2011-09-06 LAB — TECHNOLOGIST REVIEW

## 2011-09-09 ENCOUNTER — Ambulatory Visit
Admission: RE | Admit: 2011-09-09 | Discharge: 2011-09-09 | Disposition: A | Discharge status: Home / Self Care | Payer: BC Managed Care – PPO | Source: Ambulatory Visit | Attending: Oncology | Admitting: Oncology

## 2011-09-09 DIAGNOSIS — C50919 Malignant neoplasm of unspecified site of unspecified female breast: Secondary | ICD-10-CM

## 2011-09-09 MED ORDER — GADOBENATE DIMEGLUMINE 529 MG/ML IV SOLN
12.0000 mL | Freq: Once | INTRAVENOUS | Status: AC | PRN
  Administered 2011-09-09: 12 mL via INTRAVENOUS

## 2011-09-13 ENCOUNTER — Encounter (HOSPITAL_BASED_OUTPATIENT_CLINIC_OR_DEPARTMENT_OTHER): Payer: BC Managed Care – PPO | Admitting: Oncology

## 2011-09-13 ENCOUNTER — Other Ambulatory Visit: Payer: Self-pay | Admitting: Oncology

## 2011-09-13 DIAGNOSIS — C50019 Malignant neoplasm of nipple and areola, unspecified female breast: Secondary | ICD-10-CM

## 2011-09-13 DIAGNOSIS — Z5111 Encounter for antineoplastic chemotherapy: Secondary | ICD-10-CM

## 2011-09-13 DIAGNOSIS — Z170 Estrogen receptor positive status [ER+]: Secondary | ICD-10-CM

## 2011-09-13 DIAGNOSIS — C773 Secondary and unspecified malignant neoplasm of axilla and upper limb lymph nodes: Secondary | ICD-10-CM

## 2011-09-13 DIAGNOSIS — C50919 Malignant neoplasm of unspecified site of unspecified female breast: Secondary | ICD-10-CM

## 2011-09-13 LAB — CBC WITH DIFFERENTIAL/PLATELET
Basophils Absolute: 0.1 10*3/uL (ref 0.0–0.1)
EOS%: 1.3 % (ref 0.0–7.0)
HGB: 9.6 g/dL — ABNORMAL LOW (ref 11.6–15.9)
MCH: 32.8 pg (ref 25.1–34.0)
MCHC: 33.3 g/dL (ref 31.5–36.0)
MCV: 98.3 fL (ref 79.5–101.0)
MONO%: 8.5 % (ref 0.0–14.0)
RDW: 16 % — ABNORMAL HIGH (ref 11.2–14.5)

## 2011-09-13 LAB — BASIC METABOLIC PANEL
BUN: 10 mg/dL (ref 6–23)
Calcium: 8.9 mg/dL (ref 8.4–10.5)
Chloride: 106 mEq/L (ref 96–112)
Creatinine, Ser: 0.64 mg/dL (ref 0.50–1.10)

## 2011-09-13 LAB — TECHNOLOGIST REVIEW

## 2011-09-20 ENCOUNTER — Encounter (HOSPITAL_BASED_OUTPATIENT_CLINIC_OR_DEPARTMENT_OTHER): Payer: BC Managed Care – PPO | Admitting: Oncology

## 2011-09-20 ENCOUNTER — Other Ambulatory Visit: Payer: Self-pay | Admitting: Oncology

## 2011-09-20 DIAGNOSIS — C50919 Malignant neoplasm of unspecified site of unspecified female breast: Secondary | ICD-10-CM

## 2011-09-20 DIAGNOSIS — C50019 Malignant neoplasm of nipple and areola, unspecified female breast: Secondary | ICD-10-CM

## 2011-09-20 DIAGNOSIS — C773 Secondary and unspecified malignant neoplasm of axilla and upper limb lymph nodes: Secondary | ICD-10-CM

## 2011-09-20 DIAGNOSIS — Z5111 Encounter for antineoplastic chemotherapy: Secondary | ICD-10-CM

## 2011-09-20 DIAGNOSIS — Z170 Estrogen receptor positive status [ER+]: Secondary | ICD-10-CM

## 2011-09-20 DIAGNOSIS — M7989 Other specified soft tissue disorders: Secondary | ICD-10-CM

## 2011-09-20 LAB — CBC WITH DIFFERENTIAL/PLATELET
Basophils Absolute: 0 10*3/uL (ref 0.0–0.1)
Eosinophils Absolute: 0.1 10*3/uL (ref 0.0–0.5)
HGB: 10.2 g/dL — ABNORMAL LOW (ref 11.6–15.9)
MONO#: 0.1 10*3/uL (ref 0.1–0.9)
NEUT#: 7 10*3/uL — ABNORMAL HIGH (ref 1.5–6.5)
RBC: 3.03 10*6/uL — ABNORMAL LOW (ref 3.70–5.45)
RDW: 17.3 % — ABNORMAL HIGH (ref 11.2–14.5)
WBC: 8.6 10*3/uL (ref 3.9–10.3)
lymph#: 1.3 10*3/uL (ref 0.9–3.3)

## 2011-09-20 LAB — BASIC METABOLIC PANEL
BUN: 11 mg/dL (ref 6–23)
Chloride: 102 mEq/L (ref 96–112)
Glucose, Bld: 126 mg/dL — ABNORMAL HIGH (ref 70–99)
Potassium: 4.1 mEq/L (ref 3.5–5.3)
Sodium: 137 mEq/L (ref 135–145)

## 2011-09-27 ENCOUNTER — Encounter (HOSPITAL_BASED_OUTPATIENT_CLINIC_OR_DEPARTMENT_OTHER): Payer: BC Managed Care – PPO | Admitting: Oncology

## 2011-09-27 ENCOUNTER — Other Ambulatory Visit: Payer: Self-pay | Admitting: Oncology

## 2011-09-27 DIAGNOSIS — Z170 Estrogen receptor positive status [ER+]: Secondary | ICD-10-CM

## 2011-09-27 DIAGNOSIS — C773 Secondary and unspecified malignant neoplasm of axilla and upper limb lymph nodes: Secondary | ICD-10-CM

## 2011-09-27 DIAGNOSIS — Z5111 Encounter for antineoplastic chemotherapy: Secondary | ICD-10-CM

## 2011-09-27 DIAGNOSIS — C50019 Malignant neoplasm of nipple and areola, unspecified female breast: Secondary | ICD-10-CM

## 2011-09-27 DIAGNOSIS — C50919 Malignant neoplasm of unspecified site of unspecified female breast: Secondary | ICD-10-CM

## 2011-09-27 LAB — CBC WITH DIFFERENTIAL/PLATELET
BASO%: 0.5 % (ref 0.0–2.0)
Eosinophils Absolute: 0.1 10*3/uL (ref 0.0–0.5)
HCT: 29.8 % — ABNORMAL LOW (ref 34.8–46.6)
LYMPH%: 17.4 % (ref 14.0–49.7)
MCHC: 33.2 g/dL (ref 31.5–36.0)
MCV: 96.4 fL (ref 79.5–101.0)
MONO#: 0.6 10*3/uL (ref 0.1–0.9)
MONO%: 6.9 % (ref 0.0–14.0)
NEUT%: 74 % (ref 38.4–76.8)
Platelets: 301 10*3/uL (ref 145–400)
RBC: 3.09 10*6/uL — ABNORMAL LOW (ref 3.70–5.45)
WBC: 8.2 10*3/uL (ref 3.9–10.3)
nRBC: 0 % (ref 0–0)

## 2011-09-27 LAB — BASIC METABOLIC PANEL
BUN: 14 mg/dL (ref 6–23)
CO2: 24 mEq/L (ref 19–32)
Calcium: 9.2 mg/dL (ref 8.4–10.5)
Creatinine, Ser: 0.68 mg/dL (ref 0.50–1.10)
Glucose, Bld: 98 mg/dL (ref 70–99)

## 2011-10-02 ENCOUNTER — Ambulatory Visit
Admission: RE | Admit: 2011-10-02 | Discharge: 2011-10-02 | Disposition: A | Discharge status: Home / Self Care | Payer: BC Managed Care – PPO | Source: Ambulatory Visit | Attending: Radiation Oncology | Admitting: Radiation Oncology

## 2011-10-02 DIAGNOSIS — C50919 Malignant neoplasm of unspecified site of unspecified female breast: Secondary | ICD-10-CM | POA: Insufficient documentation

## 2011-10-02 DIAGNOSIS — C773 Secondary and unspecified malignant neoplasm of axilla and upper limb lymph nodes: Secondary | ICD-10-CM | POA: Insufficient documentation

## 2011-10-04 ENCOUNTER — Encounter (HOSPITAL_BASED_OUTPATIENT_CLINIC_OR_DEPARTMENT_OTHER): Payer: BC Managed Care – PPO | Admitting: Oncology

## 2011-10-04 ENCOUNTER — Other Ambulatory Visit: Payer: Self-pay | Admitting: Oncology

## 2011-10-04 DIAGNOSIS — C50919 Malignant neoplasm of unspecified site of unspecified female breast: Secondary | ICD-10-CM

## 2011-10-04 DIAGNOSIS — Z5111 Encounter for antineoplastic chemotherapy: Secondary | ICD-10-CM

## 2011-10-04 DIAGNOSIS — Z170 Estrogen receptor positive status [ER+]: Secondary | ICD-10-CM

## 2011-10-04 DIAGNOSIS — C773 Secondary and unspecified malignant neoplasm of axilla and upper limb lymph nodes: Secondary | ICD-10-CM

## 2011-10-04 DIAGNOSIS — C50019 Malignant neoplasm of nipple and areola, unspecified female breast: Secondary | ICD-10-CM

## 2011-10-04 LAB — CBC WITH DIFFERENTIAL/PLATELET
BASO%: 0.9 % (ref 0.0–2.0)
EOS%: 1.1 % (ref 0.0–7.0)
MCH: 31.9 pg (ref 25.1–34.0)
MCHC: 33 g/dL (ref 31.5–36.0)
MONO#: 0.6 10*3/uL (ref 0.1–0.9)
NEUT%: 73.2 % (ref 38.4–76.8)
RBC: 3.48 10*6/uL — ABNORMAL LOW (ref 3.70–5.45)
WBC: 8.8 10*3/uL (ref 3.9–10.3)
lymph#: 1.6 10*3/uL (ref 0.9–3.3)
nRBC: 0 % (ref 0–0)

## 2011-10-04 LAB — COMPREHENSIVE METABOLIC PANEL
ALT: 19 U/L (ref 0–35)
AST: 16 U/L (ref 0–37)
Albumin: 3.9 g/dL (ref 3.5–5.2)
Alkaline Phosphatase: 55 U/L (ref 39–117)
BUN: 11 mg/dL (ref 6–23)
Calcium: 9 mg/dL (ref 8.4–10.5)
Chloride: 101 mEq/L (ref 96–112)
Creatinine, Ser: 0.8 mg/dL (ref 0.50–1.10)
Potassium: 3.9 mEq/L (ref 3.5–5.3)

## 2011-10-07 ENCOUNTER — Ambulatory Visit: Payer: BC Managed Care – PPO | Attending: Oncology | Admitting: Physical Therapy

## 2011-10-07 DIAGNOSIS — IMO0001 Reserved for inherently not codable concepts without codable children: Secondary | ICD-10-CM | POA: Insufficient documentation

## 2011-10-07 DIAGNOSIS — I890 Lymphedema, not elsewhere classified: Secondary | ICD-10-CM | POA: Insufficient documentation

## 2011-10-09 ENCOUNTER — Emergency Department (HOSPITAL_COMMUNITY)
Admission: EM | Admit: 2011-10-09 | Discharge: 2011-10-09 | Disposition: A | Discharge status: Home / Self Care | Payer: BC Managed Care – PPO | Attending: Emergency Medicine | Admitting: Emergency Medicine

## 2011-10-09 ENCOUNTER — Encounter (HOSPITAL_COMMUNITY): Payer: Self-pay | Admitting: *Deleted

## 2011-10-09 ENCOUNTER — Emergency Department (HOSPITAL_COMMUNITY): Payer: BC Managed Care – PPO

## 2011-10-09 DIAGNOSIS — C50919 Malignant neoplasm of unspecified site of unspecified female breast: Secondary | ICD-10-CM | POA: Insufficient documentation

## 2011-10-09 DIAGNOSIS — Z9010 Acquired absence of unspecified breast and nipple: Secondary | ICD-10-CM | POA: Insufficient documentation

## 2011-10-09 DIAGNOSIS — E871 Hypo-osmolality and hyponatremia: Secondary | ICD-10-CM | POA: Insufficient documentation

## 2011-10-09 DIAGNOSIS — E876 Hypokalemia: Secondary | ICD-10-CM | POA: Insufficient documentation

## 2011-10-09 DIAGNOSIS — Z87891 Personal history of nicotine dependence: Secondary | ICD-10-CM | POA: Insufficient documentation

## 2011-10-09 DIAGNOSIS — J069 Acute upper respiratory infection, unspecified: Secondary | ICD-10-CM

## 2011-10-09 DIAGNOSIS — R509 Fever, unspecified: Secondary | ICD-10-CM | POA: Insufficient documentation

## 2011-10-09 DIAGNOSIS — Z85828 Personal history of other malignant neoplasm of skin: Secondary | ICD-10-CM | POA: Insufficient documentation

## 2011-10-09 DIAGNOSIS — I10 Essential (primary) hypertension: Secondary | ICD-10-CM | POA: Insufficient documentation

## 2011-10-09 LAB — URINALYSIS, ROUTINE W REFLEX MICROSCOPIC
Glucose, UA: NEGATIVE mg/dL
Leukocytes, UA: NEGATIVE
Protein, ur: NEGATIVE mg/dL
Urobilinogen, UA: 0.2 mg/dL (ref 0.0–1.0)

## 2011-10-09 LAB — CBC
HCT: 30.1 % — ABNORMAL LOW (ref 36.0–46.0)
MCH: 32.7 pg (ref 26.0–34.0)
MCHC: 33.9 g/dL (ref 30.0–36.0)
MCV: 96.5 fL (ref 78.0–100.0)
RDW: 14.8 % (ref 11.5–15.5)

## 2011-10-09 LAB — DIFFERENTIAL
Basophils Absolute: 0 10*3/uL (ref 0.0–0.1)
Basophils Relative: 1 % (ref 0–1)
Eosinophils Absolute: 0.1 10*3/uL (ref 0.0–0.7)
Eosinophils Relative: 1 % (ref 0–5)
Lymphocytes Relative: 11 % — ABNORMAL LOW (ref 12–46)
Monocytes Absolute: 0.3 10*3/uL (ref 0.1–1.0)

## 2011-10-09 LAB — COMPREHENSIVE METABOLIC PANEL
AST: 15 U/L (ref 0–37)
CO2: 30 mEq/L (ref 19–32)
Calcium: 9.2 mg/dL (ref 8.4–10.5)
Creatinine, Ser: 1.04 mg/dL (ref 0.50–1.10)
GFR calc non Af Amer: 63 mL/min — ABNORMAL LOW (ref >90)
Total Protein: 6.7 g/dL (ref 6.0–8.3)

## 2011-10-09 MED ORDER — AZITHROMYCIN 250 MG PO TABS: 250.0000 mg | ORAL_TABLET | Freq: Every day | ORAL | Status: DC

## 2011-10-09 MED ORDER — AZITHROMYCIN 250 MG PO TABS: 250.0000 mg | ORAL_TABLET | Freq: Every day | ORAL | Status: AC

## 2011-10-09 NOTE — ED Provider Notes (Signed)
CSN: [id number] Arrival date & time: 10/09/2011  6:01 PM   First MD Initiated Contact with Patient 10/09/11 1809      Chief Complaint  Patient presents with  . Fever   HPI [name] is a 45 y.o. female who presents to the Emergency Department complaining of Fever. Patient complains of a 102.8 fever since yesterday with associated sore throat. She reports having chemotherapy every Friday.   Past Medical History  Diagnosis Date  . Hypertension   . Cancer     basal cell - skin  . Breast cancer, stage 4 dx'd 03/12/11    left    Past Surgical History  Procedure Date  . Lipoma excision 5 years    upper back  . Mastectomy   . Mastectomy partial / lumpectomy   . Lymph node dissection   . Axillary lymph node dissection   . Dilation and curettage of uterus     Family History  Problem Relation Age of Onset  . Breast cancer Maternal Grandmother   . Lung cancer Paternal Grandfather     History  Substance Use Topics  . Smoking status: Former Games developer  . Smokeless tobacco: Not on file  . Alcohol Use: Yes     occasionally    OB History    Grav Para Term Preterm Abortions TAB SAB Ect Mult Living                  Review of Systems  Gastrointestinal: Negative for vomiting and diarrhea.  Genitourinary: Negative for dysuria.  All other systems reviewed and are negative.    Allergies  Latex  Home Medications   Current Outpatient Rx  Name Route Sig Dispense Refill  . ALPRAZOLAM ER 0.5 MG PO TB24 Oral Take 0.5 mg by mouth at bedtime as needed. For anxiety and/or sleep    . ATENOLOL 25 MG PO TABS Oral Take 25 mg by mouth daily.      . IBUPROFEN 200 MG PO TABS Oral Take 400 mg by mouth as needed. For fever     . TRIAMTERENE-HCTZ 37.5-25 MG PO CAPS Oral Take 1 capsule by mouth every morning.      . AZITHROMYCIN 250 MG PO TABS Oral Take 1 tablet (250 mg total) by mouth daily. Take first 2 tablets together, then 1 every day until finished. 6 tablet 0  .  HYDROCODONE-ACETAMINOPHEN 5-500 MG PO CAPS Oral Take 1 capsule by mouth every 6 (six) hours as needed. For pain      BP 134/88  Pulse 112  Temp(Src) 99.4 F (37.4 C) (Oral)  Resp 20  Ht 5' 4.5" (1.638 m)  Wt 140 lb (63.504 kg)  BMI 23.66 kg/m2  SpO2 94%  LMP 07/09/2011  Physical Exam  Nursing note and vitals reviewed. Constitutional: She appears well-developed and well-nourished. She is active and cooperative. No distress.       Patient appears comfortable and is no apparent distress  HENT:  Head: Normocephalic and atraumatic.  Right Ear: External ear normal.  Left Ear: External ear normal.  Mouth/Throat: Uvula is midline and mucous membranes are normal. Posterior oropharyngeal erythema present. No oropharyngeal exudate.  Eyes: Conjunctivae are normal. Right eye exhibits no discharge. Left eye exhibits no discharge. No scleral icterus.  Neck: Neck supple. No tracheal deviation present.  Cardiovascular: Normal rate, regular rhythm and intact distal pulses.   Pulmonary/Chest: Effort normal and breath sounds normal. No stridor. No respiratory distress. She has no wheezes. She has no rales.  Abdominal: Soft. Bowel sounds are normal. She exhibits no distension. There is no tenderness. There is no rebound and no guarding.  Musculoskeletal: She exhibits no edema and no tenderness.       Mild fullness in the right super clavicle area  Neurological: She is alert. She has normal strength. No sensory deficit. Cranial nerve deficit:  no gross defecits noted. She exhibits normal muscle tone. She displays no seizure activity. Coordination normal.  Skin: Skin is warm and dry. No rash noted.  Psychiatric: She has a normal mood and affect.    ED Course  Procedures  Labs Reviewed  CBC - Abnormal; Notable for the following:    RBC 3.12 (*)    Hemoglobin 10.2 (*)    HCT 30.1 (*)    All other components within normal limits  DIFFERENTIAL - Abnormal; Notable for the following:    Neutrophils  Relative 85 (*)    Lymphocytes Relative 11 (*)    All other components within normal limits  COMPREHENSIVE METABOLIC PANEL - Abnormal; Notable for the following:    Sodium 130 (*)    Potassium 2.9 (*)    Chloride 90 (*)    Glucose, Bld 128 (*)    GFR calc non Af Amer 63 (*)    GFR calc Af Amer 73 (*)    All other components within normal limits  RAPID STREP SCREEN  URINALYSIS, ROUTINE W REFLEX MICROSCOPIC   Dg Chest 2 View  10/09/2011  *RADIOLOGY REPORT*  Clinical Data: Fever.  Breast cancer  CHEST - 2 VIEW  Comparison: 04/23/2011  Findings: Lungs are clear without infiltrate or effusion.  Negative for pneumonia.  No mass or adenopathy.  Port-A-Cath tip in the SVC, unchanged.  IMPRESSION: No active cardiopulmonary disease.  Original Report Authenticated By: Camelia Phenes, M.D.     1. Fever   2. Hyponatremia   3. Upper respiratory infection       MDM  Patient is on chemotherapy for breast cancer. She's had a fever sore throat cough nasal congestion. She had a fever up to 102.8 at home. She feels good overall, there is a decreased appetite. Her chest x-ray shows no pneumonia. Her white count is 8.8. She does have a hyponatremia and hypokalemia. She states that she has a prescription at home for some potassium as she hasn't taken. She is hungry again is going to need some more food. She is followup in 2 days to be rechecked. She'll be discharged home on antibiotics since she is on chemotherapy.    Scribe Attestation I personally performed the services described in this documentation, which was scribed in my presence. The recorded information has been reviewed and considered.     Juliet Rude. Rubin Payor, MD 10/09/11 2001

## 2011-10-09 NOTE — ED Notes (Signed)
Pt c/o fever since yesterday and sore throat since Saturday. Pt states she has chemo every Friday and had a temp of 102.8.

## 2011-10-11 ENCOUNTER — Other Ambulatory Visit: Payer: Self-pay | Admitting: Oncology

## 2011-10-11 ENCOUNTER — Encounter (HOSPITAL_BASED_OUTPATIENT_CLINIC_OR_DEPARTMENT_OTHER): Payer: BC Managed Care – PPO | Admitting: Oncology

## 2011-10-11 DIAGNOSIS — C50919 Malignant neoplasm of unspecified site of unspecified female breast: Secondary | ICD-10-CM

## 2011-10-11 DIAGNOSIS — Z170 Estrogen receptor positive status [ER+]: Secondary | ICD-10-CM

## 2011-10-11 DIAGNOSIS — Z5111 Encounter for antineoplastic chemotherapy: Secondary | ICD-10-CM

## 2011-10-11 DIAGNOSIS — C773 Secondary and unspecified malignant neoplasm of axilla and upper limb lymph nodes: Secondary | ICD-10-CM

## 2011-10-11 DIAGNOSIS — C50019 Malignant neoplasm of nipple and areola, unspecified female breast: Secondary | ICD-10-CM

## 2011-10-11 LAB — COMPREHENSIVE METABOLIC PANEL
ALT: 22 U/L (ref 0–35)
AST: 19 U/L (ref 0–37)
Albumin: 4.2 g/dL (ref 3.5–5.2)
Calcium: 9.4 mg/dL (ref 8.4–10.5)
Chloride: 96 mEq/L (ref 96–112)
Potassium: 3.4 mEq/L — ABNORMAL LOW (ref 3.5–5.3)

## 2011-10-11 LAB — CBC WITH DIFFERENTIAL/PLATELET
BASO%: 1.1 % (ref 0.0–2.0)
HCT: 29.6 % — ABNORMAL LOW (ref 34.8–46.6)
MCHC: 33.4 g/dL (ref 31.5–36.0)
MONO#: 0.5 10*3/uL (ref 0.1–0.9)
RBC: 3.17 10*6/uL — ABNORMAL LOW (ref 3.70–5.45)
WBC: 6.6 10*3/uL (ref 3.9–10.3)
lymph#: 1.2 10*3/uL (ref 0.9–3.3)
nRBC: 0 % (ref 0–0)

## 2011-10-18 ENCOUNTER — Other Ambulatory Visit: Payer: Self-pay | Admitting: Oncology

## 2011-10-18 ENCOUNTER — Encounter: Payer: Self-pay | Admitting: Oncology

## 2011-10-18 ENCOUNTER — Ambulatory Visit (HOSPITAL_BASED_OUTPATIENT_CLINIC_OR_DEPARTMENT_OTHER): Payer: BC Managed Care – PPO | Admitting: Oncology

## 2011-10-18 DIAGNOSIS — C773 Secondary and unspecified malignant neoplasm of axilla and upper limb lymph nodes: Secondary | ICD-10-CM

## 2011-10-18 DIAGNOSIS — C50919 Malignant neoplasm of unspecified site of unspecified female breast: Secondary | ICD-10-CM

## 2011-10-18 DIAGNOSIS — C50019 Malignant neoplasm of nipple and areola, unspecified female breast: Secondary | ICD-10-CM

## 2011-10-18 DIAGNOSIS — Z170 Estrogen receptor positive status [ER+]: Secondary | ICD-10-CM

## 2011-10-18 DIAGNOSIS — C50119 Malignant neoplasm of central portion of unspecified female breast: Secondary | ICD-10-CM

## 2011-10-18 DIAGNOSIS — C50412 Malignant neoplasm of upper-outer quadrant of left female breast: Secondary | ICD-10-CM | POA: Insufficient documentation

## 2011-10-18 DIAGNOSIS — Z5111 Encounter for antineoplastic chemotherapy: Secondary | ICD-10-CM

## 2011-10-18 HISTORY — DX: Malignant neoplasm of unspecified site of unspecified female breast: C50.919

## 2011-10-18 LAB — CBC WITH DIFFERENTIAL/PLATELET
BASO%: 1.5 % (ref 0.0–2.0)
EOS%: 2.7 % (ref 0.0–7.0)
HCT: 30.9 % — ABNORMAL LOW (ref 34.8–46.6)
MCH: 30.8 pg (ref 25.1–34.0)
MCHC: 32.7 g/dL (ref 31.5–36.0)
MONO%: 7 % (ref 0.0–14.0)
NEUT%: 58.6 % (ref 38.4–76.8)
RDW: 14.9 % — ABNORMAL HIGH (ref 11.2–14.5)
lymph#: 2.2 10*3/uL (ref 0.9–3.3)

## 2011-10-18 LAB — COMPREHENSIVE METABOLIC PANEL
AST: 20 U/L (ref 0–37)
Albumin: 4 g/dL (ref 3.5–5.2)
Alkaline Phosphatase: 56 U/L (ref 39–117)
BUN: 11 mg/dL (ref 6–23)
Potassium: 3.8 mEq/L (ref 3.5–5.3)
Total Bilirubin: 0.3 mg/dL (ref 0.3–1.2)

## 2011-10-21 NOTE — Progress Notes (Signed)
CC:   Lurline Hare, M.D. Catalina Pizza, M.D. Adolph Pollack, M.D.  DIAGNOSIS:  45 year old female with stage III (T4 N2) invasive lobular carcinoma of the left breast, status post lumpectomy with axillary lymph node dissection.  PAST THERAPY: 1. Status post 4 cycles of dose-dense AC given from 05/25/2011 to     07/16/2011. 2. The patient is now status post 12 weeks of single-agent Taxol     completed on 10/18/2011.  INTERVAL HISTORY:  The patient is seen in followup today.  She is going to get her final weekly Taxol.  Overall, she tolerated her treatment well.  She is quite nervous today regarding her followup care, and I have tried very hard to reassure her.  Her husband was in the room.  She is denying any nausea, vomiting, fevers, chills.  She has no night sweats.  She is, however, having significant hot flashes.  The remainder of the 10-point review of systems is negative and scanned separately. She did not get her EMLA cream on today, and she is very anxious and nervous about that as well.  PHYSICAL EXAMINATION:  General:  The patient is awake, alert, in no acute distress.  She appears well.  Vital Signs:  Temperature is 98.2, pulse 50, respirations 20, blood pressure 135/83 and weight is 151.3 pounds.  HEENT Exam:  EOMI.  PERRLA.  Sclerae are anicteric.  No conjunctival pallor.  Oral mucosa is moist.  Neck:  Supple.  Lungs: Clear bilaterally to auscultation and percussion.  Cardiovascular: Regular rate and rhythm.  No murmurs.  Abdomen:  Soft, nontender, nondistended.  Bowel sounds are present.  No HSM.  Extremities:  No edema.  Neuro:  Patient is alert, oriented, otherwise nonfocal.  LABORATORY DATA:  WBC 7.3, hemoglobin 10.1, hematocrit 30.9, platelets are 378,000.  IMPRESSION AND PLAN:  45 year old female with stage III (T4 N2) invasive lobular carcinoma, status post lumpectomy with a positive lymph node. She will proceed with week 12 of Taxol.  Thereafter, she is  going to be seen by Radiation Oncology to start her radiation therapy to the left breast.  Once she completes that, I will get her on tamoxifen 20 mg daily, and I have discussed this with her.  She will receive a total of 5 years of tamoxifen.  All questions are answered today.  Again, the patient is quite anxious, and we had an extensive discussion.  I spent 45 minutes with the patient, and greater than 50% of the time was in counseling and coordination of care.    ______________________________ Drue Second, M.D. KK/MEDQ  D:  10/18/2011  T:  10/21/2011  Job:  [id number]

## 2011-10-23 ENCOUNTER — Telehealth (INDEPENDENT_AMBULATORY_CARE_PROVIDER_SITE_OTHER): Payer: Self-pay | Admitting: General Surgery

## 2011-10-23 NOTE — Telephone Encounter (Signed)
Patients wants to know if it is necessary to see Dr. Armanda Heritage for the [facility] removal or if sx can be scheduled directly, the reason she is asking is because her deductable has been meet and she would like to have this done before the year is over?  She also asked if she can't have this done this year can someone else in the practice see her to get this done before the year is over, please call.

## 2011-10-24 ENCOUNTER — Encounter (INDEPENDENT_AMBULATORY_CARE_PROVIDER_SITE_OTHER): Payer: Self-pay | Admitting: General Surgery

## 2011-10-24 ENCOUNTER — Telehealth (INDEPENDENT_AMBULATORY_CARE_PROVIDER_SITE_OTHER): Payer: Self-pay

## 2011-10-24 ENCOUNTER — Ambulatory Visit (INDEPENDENT_AMBULATORY_CARE_PROVIDER_SITE_OTHER): Payer: BC Managed Care – PPO | Admitting: General Surgery

## 2011-10-24 VITALS — BP 134/86 | HR 64 | Temp 97.6°F | Resp 20 | Ht 65.0 in | Wt 147.2 lb

## 2011-10-24 DIAGNOSIS — C50919 Malignant neoplasm of unspecified site of unspecified female breast: Secondary | ICD-10-CM

## 2011-10-24 NOTE — Progress Notes (Signed)
Operation:  Left partial mastectomy and sentinel lymph node biopsy April 09, 2011; left axillary lymph node dissection Apr 23, 2011  Stage:  3  Hormone receptor status: Positive  HPI:  [name] is here for followup of her left breast cancer. She has completed her chemotherapy. He is to start radiation therapy later this month.  Her Port-A-Cath is ready for removal. He's had a little swelling in her left upper extremity and some of her feeling is starting to come back. No masses in the left breast or chest wall area.  PE: Gen.-she looks well and in no acute distress.  Left breast-well-healed central scar with no nodularity and no palpable masses.  Lymph nodes-no left axillary adenopathy.  Extremities-mild left forearm lymphedema  Assessment:  Stage III left breast cancer-no clinical evidence of recurrence; has some lymphedema. Has a retained Port-A-Cath.  Plan: Lymphedema sleeve to be worn at night. Otherwise activities as tolerated. Outpatient Port-A-Cath removal. Return visit 3 months.

## 2011-10-24 NOTE — Telephone Encounter (Signed)
[name] called and wanted to know the strength of the compression sleeve.  Dr Abbey Chatters wrote a script for it.  I paged him and he said her lymphedema is mild so probably the lowest setting, otherwise they can check at the lymphedema clinic.  I called this to [facility].

## 2011-10-24 NOTE — Patient Instructions (Signed)
Activities as tolerated. Where lymphedema sleeve at night as needed.

## 2011-10-28 DIAGNOSIS — Z452 Encounter for adjustment and management of vascular access device: Secondary | ICD-10-CM

## 2011-10-29 ENCOUNTER — Telehealth (INDEPENDENT_AMBULATORY_CARE_PROVIDER_SITE_OTHER): Payer: Self-pay

## 2011-10-29 NOTE — Telephone Encounter (Signed)
Pt would like something to help her sleep at night.  The Vicodin helps with her pain, but she cannot rest.  I told her I would ask Dr. Abbey Chatters and call her back.

## 2011-10-31 ENCOUNTER — Telehealth (INDEPENDENT_AMBULATORY_CARE_PROVIDER_SITE_OTHER): Payer: Self-pay

## 2011-10-31 NOTE — Telephone Encounter (Signed)
LMOM for pt to try her Xanax to help her sleep or call her PCP and ask for a sleep aid.  Dr. Abbey Chatters does not prescribe sleep medication for his patients.

## 2011-11-04 ENCOUNTER — Other Ambulatory Visit: Payer: Self-pay | Admitting: *Deleted

## 2011-11-04 DIAGNOSIS — R05 Cough: Secondary | ICD-10-CM

## 2011-11-04 MED ORDER — AZITHROMYCIN 1 G PO PACK: 1.0000 | PACK | Freq: Once | ORAL | Status: AC

## 2011-11-04 NOTE — Telephone Encounter (Signed)
Pt called co/ congestion, sinus drainage(greenish/yellow), productive cough. Reviewed with CS, VO for the patient to take Z-pack  As directed. Verified Pt Pharmacy-[facility] [place]. Pt verbalized understanding.

## 2011-11-15 ENCOUNTER — Telehealth: Payer: Self-pay | Admitting: Oncology

## 2011-11-15 NOTE — Telephone Encounter (Signed)
called pt lm that we cancelled her appt for 12/05 and to rtn call to r/s appt for jan2013

## 2011-11-18 ENCOUNTER — Encounter (INDEPENDENT_AMBULATORY_CARE_PROVIDER_SITE_OTHER): Payer: BC Managed Care – PPO | Admitting: General Surgery

## 2011-11-20 ENCOUNTER — Other Ambulatory Visit: Payer: BC Managed Care – PPO | Admitting: Lab

## 2011-11-20 ENCOUNTER — Ambulatory Visit: Payer: BC Managed Care – PPO | Admitting: Oncology

## 2011-11-25 ENCOUNTER — Telehealth (INDEPENDENT_AMBULATORY_CARE_PROVIDER_SITE_OTHER): Payer: Self-pay

## 2011-11-25 NOTE — Telephone Encounter (Signed)
Pt calling in requesting a written Rx for lymphadema glove to go along with her lymphadema sleeve that she got a few wks a go. Pls call her at [phone] when ready to be picked up./ [facility]

## 2011-11-28 ENCOUNTER — Other Ambulatory Visit: Payer: Self-pay | Admitting: Oncology

## 2011-11-29 ENCOUNTER — Telehealth: Payer: Self-pay | Admitting: *Deleted

## 2011-11-29 ENCOUNTER — Encounter: Payer: Self-pay | Admitting: *Deleted

## 2011-11-29 NOTE — Telephone Encounter (Signed)
returned patient voice message to reschedule patient to come in and see dr.khan

## 2011-11-29 NOTE — Telephone Encounter (Signed)
Called pt lmovm to call back concerning when her Radiation treatment will be completed. Pt will need to f/u with MD at that time.  ONC TX Sched sent

## 2011-12-02 ENCOUNTER — Ambulatory Visit (HOSPITAL_BASED_OUTPATIENT_CLINIC_OR_DEPARTMENT_OTHER): Payer: BC Managed Care – PPO | Admitting: Oncology

## 2011-12-02 ENCOUNTER — Other Ambulatory Visit: Payer: Self-pay | Admitting: Oncology

## 2011-12-02 ENCOUNTER — Encounter: Payer: Self-pay | Admitting: Oncology

## 2011-12-02 ENCOUNTER — Other Ambulatory Visit (HOSPITAL_BASED_OUTPATIENT_CLINIC_OR_DEPARTMENT_OTHER): Payer: BC Managed Care – PPO | Admitting: Lab

## 2011-12-02 DIAGNOSIS — Z5111 Encounter for antineoplastic chemotherapy: Secondary | ICD-10-CM

## 2011-12-02 DIAGNOSIS — C50919 Malignant neoplasm of unspecified site of unspecified female breast: Secondary | ICD-10-CM

## 2011-12-02 DIAGNOSIS — Z09 Encounter for follow-up examination after completed treatment for conditions other than malignant neoplasm: Secondary | ICD-10-CM

## 2011-12-02 DIAGNOSIS — Z79810 Long term (current) use of selective estrogen receptor modulators (SERMs): Secondary | ICD-10-CM

## 2011-12-02 DIAGNOSIS — Z170 Estrogen receptor positive status [ER+]: Secondary | ICD-10-CM

## 2011-12-02 DIAGNOSIS — C773 Secondary and unspecified malignant neoplasm of axilla and upper limb lymph nodes: Secondary | ICD-10-CM

## 2011-12-02 DIAGNOSIS — T66XXXA Radiation sickness, unspecified, initial encounter: Secondary | ICD-10-CM

## 2011-12-02 HISTORY — DX: Radiation sickness, unspecified, initial encounter: T66.XXXA

## 2011-12-02 LAB — CBC WITH DIFFERENTIAL/PLATELET
Basophils Absolute: 0 10*3/uL (ref 0.0–0.1)
EOS%: 2 % (ref 0.0–7.0)
Eosinophils Absolute: 0.1 10*3/uL (ref 0.0–0.5)
HCT: 41 % (ref 34.8–46.6)
HGB: 14 g/dL (ref 11.6–15.9)
LYMPH%: 24.6 % (ref 14.0–49.7)
MCH: 31.6 pg (ref 25.1–34.0)
MCV: 92.9 fL (ref 79.5–101.0)
MONO%: 8.3 % (ref 0.0–14.0)
NEUT%: 64.6 % (ref 38.4–76.8)
Platelets: 252 10*3/uL (ref 145–400)
RDW: 14.7 % — ABNORMAL HIGH (ref 11.2–14.5)

## 2011-12-02 LAB — COMPREHENSIVE METABOLIC PANEL
AST: 21 U/L (ref 0–37)
Alkaline Phosphatase: 58 U/L (ref 39–117)
BUN: 10 mg/dL (ref 6–23)
Creatinine, Ser: 0.8 mg/dL (ref 0.50–1.10)
Glucose, Bld: 90 mg/dL (ref 70–99)
Total Bilirubin: 0.4 mg/dL (ref 0.3–1.2)

## 2011-12-02 MED ORDER — TAMOXIFEN CITRATE 20 MG PO TABS: 20.0000 mg | ORAL_TABLET | Freq: Every day | ORAL | Status: DC

## 2011-12-02 NOTE — Progress Notes (Signed)
CC:   [name], M.D. Catalina Pizza, M.D. Adolph Pollack, M.D.  DIAGNOSIS:  45 year old female with stage III (T4 N2) invasive lobular carcinoma of the left breast, status post lumpectomy with axillary lymph node dissection.  PAST THERAPY: 1. Status post 4 cycles of dose-dense AC given from 05/25/2011 to     07/16/2011. 2. The patient is now status post 12 weeks of single-agent Taxol     completed on 10/18/2011. 3. Patient is receiving radiation therapy to the left breast at [facility]. Will complete this on 12/13/11  Current Therapy: Radiation to the left breast  INTERVAL HISTORY:  The patient is seen in followup today. Overall she is doing well she has been receiving radiation therapy to the left breast in [facility] by Dr. Lurline Campos. Thus far she has tolerated it well. She is developing radiation-induced skin changes with redness but there is no evidence of blistering currently. There is no peeling. She denies any nausea vomiting fevers chills night sweats headaches shortness of breath chest pains palpitations she has no myalgias or arthralgias. Remainder of the 10 point review of systems is negative.    PHYSICAL EXAMINATION:  General:  The patient is awake, alert, in no acute distress.  She appears well.  Vital Signs:  Temperature is 98.2, pulse 50, respirations 20, blood pressure 135/83 and weight is 151.3 pounds.  HEENT Exam:  EOMI.  PERRLA.  Sclerae are anicteric.  No conjunctival pallor.  Oral mucosa is moist.  Neck:  Supple.  Lungs: Clear bilaterally to auscultation and percussion.  Cardiovascular: Regular rate and rhythm.  No murmurs.  Abdomen:  Soft, nontender, nondistended.  Bowel sounds are present.  No HSM.  Extremities:  No edema.  Neuro:  Patient is alert, oriented, otherwise nonfocal. bilateral breast exam: is performed right breast no masses nipple discharge or skin changes. Left breast is status post central lumpectomy there is radiation-induced skin changes there is  no otherwise masses.  LABORATORY DATA:   Lab Results  Component Value Date   WBC 4.6 12/02/2011   HGB 14.0 12/02/2011   HCT 41.0 12/02/2011   MCV 92.9 12/02/2011   PLT 252 12/02/2011    IMPRESSION AND PLAN:  45 year old female with stage III (T4 N2) invasive lobular carcinoma of the left breast she is status post central lumpectomy. This was then followed by chemotherapy adjuvantly consisting of Adriamycin and Cytoxan given dose dense for 4 cycles followed by weekly Taxol. She has completed all of her therapy. She is now receiving radiation therapy to the left breast at [facility] Her tumor was ER positive and therefore eventually she will require adjuvant antiestrogen therapy. Because she is premenopausal I will prescribe tamoxifen 20 mg daily to her. She will complete her radiation therapy on 12/13/2011. I have recommended that she proceed with taking tamoxifen in January. A prescription has been sent to her pharmacy and she will pick it up and start after completion of radiation therapy.  All questions are answered today patient knows to call me with any problems questions or concerns. I have set her up to be seen by me again towards middle of January 2013. All questions were answered today I spent 30 minutes with the patient today greater than 50% of the time was spent in counseling and coordination of care. ______________________________ Drue Second, M.D. KK/MEDQ  D:  10/18/2011  T:  10/21/2011  Job:  [id number]

## 2011-12-02 NOTE — Patient Instructions (Signed)
Tamoxifen oral solution  What is this medicine?  TAMOXIFEN (ta MOX i fen) blocks the effects of estrogen. It is commonly used to treat breast cancer. It is also used to decrease the chance of breast cancer coming back in women who have received treatment for the disease. It may also help prevent breast cancer in women who have a high risk of developing breast cancer.  This medicine may be used for other purposes; ask your health care provider or pharmacist if you have questions.  What should I tell my health care provider before I take this medicine?  They need to know if you have any of these conditions:  -blood clots  -blood disease  -cataracts or impaired eyesight  -endometriosis  -high calcium levels  -high cholesterol  -irregular menstrual cycles  -liver disease  -stroke  -uterine fibroids  -an unusual or allergic reaction to tamoxifen, other medicines, foods, dyes, or preservatives  -pregnant or trying to get pregnant  -breast-feeding  How should I use this medicine?  Take this medicine by mouth with a glass of water. Follow the directions on the prescription label. You can take it with or without food. Take your medicine at regular intervals. Do not take your medicine more often than directed. Do not stop taking except on your doctor's advice.  A special MedGuide will be given to you by the pharmacist with each prescription and refill. Be sure to read this information carefully each time.  Talk to your pediatrician regarding the use of this medicine in children. While this drug may be prescribed for selected conditions, precautions do apply.  Overdosage: If you think you have taken too much of this medicine contact a poison control center or emergency room at once.  NOTE: This medicine is only for you. Do not share this medicine with others.  What if I miss a dose?  If you miss a dose, take it as soon as you can. If it is almost time for your next dose, take only that dose. Do not take double or extra  doses.  What may interact with this medicine?  -aminoglutethimide  -bromocriptine  -chemotherapy drugs  -female hormones, like estrogens and birth control pills  -letrozole  -medroxyprogesterone  -phenobarbital  -rifampin  -warfarin  This list may not describe all possible interactions. Give your health care provider a list of all the medicines, herbs, non-prescription drugs, or dietary supplements you use. Also tell them if you smoke, drink alcohol, or use illegal drugs. Some items may interact with your medicine.  What should I watch for while using this medicine?  Visit your doctor or health care professional for regular checks on your progress. You will need regular pelvic exams, breast exams, and mammograms. If you are taking this medicine to reduce your risk of getting breast cancer, you should know that this medicine does not prevent all types of breast cancer. If breast cancer or other problems occur, there is no guarantee that it will be found at an early stage.  Do not become pregnant while taking this medicine or for 2 months after stopping this medicine. Stop taking this medicine if you get pregnant or think you are pregnant and contact your doctor. This medicine may harm your unborn baby. Women who can possibly become pregnant should use birth control methods that do not use hormones during tamoxifen treatment and for 2 months after therapy has stopped. Talk with your health care provider for birth control advice.  Do not breast feed   the face, lips, or tongue -breathing problems -changes in vision -changes in your menstrual cycle -difficulty walking or talking -new breast lumps -numbness -pelvic pain or pressure -redness, blistering, peeling  or loosening of the skin, including inside the mouth -sudden chest pain -swelling, pain or tenderness in your calf or leg -unusual bruising or bleeding -vaginal discharge that is bloody, brown, or rust -weakness -yellowing of the whites of the eyes or skin Side effects that usually do not require medical attention (report to your doctor or health care professional if they continue or are bothersome): -fatigue -hair loss, although uncommon and is usually mild -headache -hot flashes -impotence (in men) -nausea, vomiting (mild) -vaginal discharge (white or clear) This list may not describe all possible side effects. Call your doctor for medical advice about side effects. You may report side effects to FDA at 1-800-FDA-1088. Where should I keep my medicine? Keep out of the reach of children. Store in the original package at room temperature between 20 and 25 degrees C (68 and 77 degrees F). Do not store above 25 degrees C (77 degrees F). DO NOT freeze or refrigerate. Protect from light. Keep container tightly closed. Use within 3 months of opening. Throw away any unused medicine after the expiration date. NOTE: This sheet is a summary. It may not cover all possible information. If you have questions about this medicine, talk to your doctor, pharmacist, or health care provider.  2012, Elsevier/Gold Standard. (08/29/2008 3:48:08 PM)  Breast Cancer Survivor Follow-Up Breast cancer begins when cells in the breast divide too rapidly. The extra cells form a lump (tumor). When the cancer is treated, the goal is to get rid of all cancer cells. However, sometimes a few cells survive. These cancer cells can then grow. They become recurrent cancer. This means the cancer comes back after treatment.  Most cases of recurrent breast cancer develop 3 to 5 years after treatment. However, sometimes it comes back just a few months after treatment. Other times, it does not come back until years later. If the cancer  comes back in the same area as the first breast cancer, it is called a local recurrence. If the cancer comes back somewhere else in the body, it is called regional recurrence if the site is fairly near the breast or distant recurrence if it is far from the breast. Your caregiver may also use the term metastasize to indicate a cancer that has gone to another part of your body. Treatment is still possible after either kind of recurrence. The cancer can still be controlled.  CAUSES OF RECURRENT CANCER No one knows exactly why breast cancer starts in the first place. Why the cancer comes back after treatment is also not clear. It is known that certain conditions, called risk factors, can make this more likely. They include:  Developing breast cancer for the first time before age 50.   Having breast cancer that involves the lymph nodes. These are small, round pieces of tissue found all over the body. Their job is to help fight infections.   Having a large tumor. Cancer is more apt to come back if the first tumor was bigger than 2 inches (5 cm).   Having certain types of breast cancer, such as:   Inflammatory breast cancer. This rare type grows rapidly and causes the breast to become red and swollen.   A high-grade tumor. The grade of a tumor indicates how fast it will grow and spread. High-grade tumors grow more quickly than  other types.   HER2 cancer. This refers to the tumor's genetic makeup. Tumors that have this type of gene are more likely to come back after treatment.   Having close tumor margins. This refers to the space between the tumor and normal, noncancerous cells. If the space is small, the tumor has a greater chance of coming back.   Having treatment involving a surgery to remove the tumor but not the entire breast (lumpectomy) and no radiation therapy.  CARE AFTER BREAST CANCER Home Monitoring Women who have had breast cancer should continue to examine their breasts every month. The  goal is to catch the cancer quickly if it comes back. Many women find it helpful to do so on the same day each month and to mark the calendar as a reminder. Let your caregiver know immediately if you have any signs of recurrent breast cancer. Symptoms will vary, depending on where the cancer recurs. The original type of treatment can also make a difference. Symptoms of local recurrence after a lumpectomy or a recurrence in the opposite breast may include:  A new lump or thickening in the breast.   A change in the way the skin looks on the breast (such as a rash, dimpling, or wrinkling).   Redness or swelling of the breast.   Changes in the nipple (such as being red, puckered, swollen, or leaking fluid).  Symptoms of a recurrence after a breast removal surgery (mastectomy) may include:  A lump or thickening under the skin.   A thickening around the mastectomy scar.  Symptoms of regional recurrence in the lymph nodes near the breast may include:  A lump under the arm or above the collarbone.   Swelling of the arm.   Pain in the arm, shoulder, or chest.   Numbness in the hand or arm.  Symptoms of distant recurrence may include:  A cough that does not go away.   Trouble breathing or shortness of breath.   Pain in the bones or the chest. This is pain that lasts or does not respond to rest and medicine.   Headaches.   Sudden vision problems.   Dizziness.   Nausea or vomiting.   Losing weight without trying to.   Persistent abdominal pain.   Changes in bowel movements or blood in the stool.   Yellowing of the skin or eyes (jaundice).   Blood in the urine or bloody vaginal discharge.  Clinical Monitoring  It is helpful to keep a schedule of appointments for needed tests and exams. This includes physical exams, breast exams, exams of the lymph nodes, and general exams.   For the first 3 years after being treated for breast cancer, see your caregiver every 3 to 6 months.     For years 4 and 5 after breast cancer, see your caregiver every 6 to 12 months.   After 5 years, see your caregiver at least once a year.   Regular breast X-rays (mammograms) should continue even if you had a mastectomy.   A mammogram should be done 1 year after the mammogram that first detected breast cancer.   A mammogram should be done every 6 to 12 months after that. Follow your caregiver's advice.   A pelvic exam done by your caregiver checks whether female organs are the normal size and shape. The exam is usually done every year. Ask your caregiver if that schedule is right for you.   Women taking tamoxifen should report any vaginal bleeding immediately to  their caregiver. Tamoxifen is often given to women with a certain type of breast cancer. It has been shown to help prevent recurrence.   You will need to decide who your primary caregiver will be.   Most people continue to see their cancer specialist (oncologist) every 3 to 6 months for the first year after cancer treatment.   At some point, you may want to go back to seeing your family caregiver. You would no longer see your oncologist for regular checkups. Many women do this about 1 year after their first diagnosis of breast cancer.   You will still need to be seen every so often by your oncologist. Ask how often that should be. Coordinate this with your family or primary caregiver.   Think about having genetic counseling. This would provide information on traits that can be passed or inherited from one generation to the next. In some cases, breast cancer runs in families. Tell your caregiver if you:   Are of Ashkenazi Jewish heritage.   Have any family member who has had ovarian cancer.   Have a mother, sister, or daughter who had breast cancer before age 45.   Have 2 or more close relatives who have had breast cancer. This means a mother, sister, daughter, aunt, or grandmother.   Had breast cancer in both breasts.    Have a female relative who has had breast cancer.   Some tests are not recommended for routine screening. Someone recovering from breast cancer does not need to have these tests if there are no problems. The tests have risks, such as radiation exposure, and can be costly. The risks of these tests are thought to be greater than the benefits:   Blood tests.   Chest X-rays.   Bone scans.   Liver ultrasound.   Computed tomography (CT scan).   Positron emission tomography (PET scan).   Magnetic resonance imaging (MRI scan).  DIAGNOSIS OF RECURRENT CANCER Recurrent breast cancer may be suspected for various reasons. A mammogram may not look normal. You might feel a lump or have other symptoms. Your caregiver may find something unusual during an exam. To be sure, your caregiver will probably order some tests. The tests are needed because there are symptoms or hints of a problem. They could include:  Blood tests, including a test to check how well the liver is working. The liver is a common site for a distant cancer recurrence.   Imaging tests that create pictures of the inside of the body. These tests include:   Chest X-rays to show if the cancer has come back in the lungs.   CT scans to create detailed pictures of various areas of the body and help find a distant recurrence.   MRI scans to find anything unusual in the breast, chest, or lymph nodes.   Breast ultrasound tests to examine the breasts.   Bone scans to create a picture of your whole skeleton and find cancer in bony areas.   PET scans to create an image of the whole body. PET scans can be used together with CT scans to show more detail.   Biopsy. A small sample of tissue is taken and checked under a microscope. If cancer cells are found, they may be tested to see if they contain the HER2 gene or the hormones estrogen and progesterone. This will help your caregiver decide how to treat the recurrent cancer.  TREATMENT  How  recurrent breast cancer is treated depends on where the  new cancer is found. The type of treatment that was used for the first breast cancer makes a difference, too. A combination of treatments may be used. Options include:  Surgery.   If the cancer comes back in the breast that was not treated before, you may need a lumpectomy or mastectomy.   If the cancer comes back in the breast that was treated before, you may need a mastectomy.   The lymph nodes under the arm may need to be removed.   Radiation therapy.   For a local recurrence, radiation may be used if it was not used during the first treatment.   For a distance recurrence, radiation is sometimes used.   Chemotherapy.   This may be used before surgery to treat recurrent breast cancer.   This may be used to treat recurrent cancer that cannot be treated with surgery.   This may be used to treat a distant recurrence.   Hormone therapy.   Women with the HER2 gene may be given hormone therapy to attack this gene.  Document Released: 07/31/2011 Document Revised: 08/14/2011 Document Reviewed: 07/31/2011 [facility] Patient Information 2012 [place], [place].

## 2011-12-03 ENCOUNTER — Telehealth: Payer: Self-pay | Admitting: *Deleted

## 2011-12-03 NOTE — Telephone Encounter (Signed)
gave patient appointment for 12-2011 printed out calendar and gave to the patient 

## 2011-12-05 ENCOUNTER — Ambulatory Visit: Payer: BC Managed Care – PPO | Admitting: Oncology

## 2011-12-05 ENCOUNTER — Other Ambulatory Visit: Payer: BC Managed Care – PPO | Admitting: Lab

## 2011-12-19 ENCOUNTER — Ambulatory Visit (HOSPITAL_COMMUNITY): Payer: BC Managed Care – PPO | Admitting: Physical Therapy

## 2011-12-26 ENCOUNTER — Ambulatory Visit (HOSPITAL_COMMUNITY)
Admission: RE | Admit: 2011-12-26 | Discharge: 2011-12-26 | Disposition: A | Discharge status: Home / Self Care | Payer: BC Managed Care – PPO | Source: Ambulatory Visit | Attending: Radiation Oncology | Admitting: Radiation Oncology

## 2011-12-26 DIAGNOSIS — I10 Essential (primary) hypertension: Secondary | ICD-10-CM | POA: Insufficient documentation

## 2011-12-26 DIAGNOSIS — IMO0001 Reserved for inherently not codable concepts without codable children: Secondary | ICD-10-CM | POA: Insufficient documentation

## 2011-12-26 DIAGNOSIS — I972 Postmastectomy lymphedema syndrome: Secondary | ICD-10-CM | POA: Insufficient documentation

## 2011-12-26 NOTE — Progress Notes (Signed)
Physical Therapy Evaluation  Patient Details  Name: [name] MRN: [id number] Date of Birth: [birth year]  Today's Date: 12/26/2011 Time: 4098-1191 Time Calculation (min): 44 min  Visit#: 1  of 1   Re-eval:   Assessment Diagnosis: lymphedema Surgical Date: 04/16/11 Prior Therapy: none  Past Medical History:  Past Medical History  Diagnosis Date  . Hypertension   . Cancer     basal cell - skin  . Breast cancer, stage 4 dx'd 03/12/11    left  . Breast cancer, stage 3 10/18/2011  . Radiation adverse effect 12/02/2011   Past Surgical History:  Past Surgical History  Procedure Date  . Lipoma excision 5 years    upper back  . Mastectomy   . Mastectomy partial / lumpectomy   . Lymph node dissection   . Axillary lymph node dissection   . Dilation and curettage of uterus   . Portacath placement     Subjective Symptoms/Limitations Symptoms: Ms. Eilts states that she was diagnosed with breast cancer.  She had 11 nodes removed with 2 positive and 2 partial.  She underwent chemosurgery that was completed on 05/28/11 and radiation was completed on 12/13/11.  She states that she noticed increased swelling in her L arm in Dec.  She was referred to [facility] but did not have a good experience there and did not go back.  She states that her first visit was measuring only and she was not educated on self massage.   The patient states that her MD ordered her a sleeve so she did get the sleeve but noticed that her hand was swelling at this point.  Her MD urged her to try therapy again so she made an appointment at this facility.  At this time she states that her swelling has gone down completely and on examination it looks as if it has.  The patient states that she is going to the [facility] to get her energy back.  How long can you sit comfortably?: no problem How long can you stand comfortably?: no problem How long can you walk comfortably?: no problem Pain Assessment Currently  in Pain?: Yes Pain Score:   2 Pain Location: Axilla Pain Orientation: Left Pain Type: Chronic pain  Precautions/Restrictions  Precautions Precautions:  (lymphedema) Precaution Comments: given instruction on how to reduce risks of flare ups.  Prior Functioning     Cognition/Observation    Sensation/Coordination/Flexibility/Functional Tests    Assessment LUE Assessment LUE Assessment: Within Functional Limits  Exercise/Treatments  shown and explain the rationale for self-lymph massage.  Explained to gradually increase activity at the [facility].  Physical Therapy Assessment and Plan PT Assessment and Plan Clinical Impression Statement: one time treatment for education.  Pt told to use isotoner glove if she needs to don her sleeve. Rehab Potential: Good PT Plan: D/C to HEP    Goals Home Exercise Program Pt will Perform Home Exercise Program: Independently PT Goal: Perform Home Exercise Program - Progress: Met PT Short Term Goals PT Short Term Goal 1: I in skin care  PT Short Term Goal 1 - Progress: Met PT Short Term Goal 2: Pt to be I in lymphedema self massage PT Short Term Goal 2 - Progress: Met PT Short Term Goal 3: Pt to be knowledgeable of donning sleeve and isotoner glove if any swelling occurs.  Problem List Patient Active Problem List  Diagnoses  . FOOT SPRAIN  . Breast cancer, stage 3  . Radiation adverse effect    PT -  End of Session Activity Tolerance: Patient tolerated treatment well General Behavior During Session: [facility] for tasks performed Cognition: [facility] for tasks performed   Antinio Sanderfer,CINDY 12/26/2011, 4:35 PM  Physician Documentation Your signature is required to indicate approval of the treatment plan as stated above.  Please sign and either send electronically or make a copy of this report for your files and return this physician signed original.   Please mark one 1.__approve of plan  2. ___approve of plan with the following  conditions.   ______________________________                                                          _____________________ Physician Signature                                                                                                             Date

## 2011-12-26 NOTE — Patient Instructions (Addendum)
HEP for self lymphedema massage; care of skin and use of isotoner glove with sleeve.

## 2012-01-03 ENCOUNTER — Ambulatory Visit: Payer: BC Managed Care – PPO | Admitting: Oncology

## 2012-01-03 ENCOUNTER — Other Ambulatory Visit (HOSPITAL_BASED_OUTPATIENT_CLINIC_OR_DEPARTMENT_OTHER): Payer: BC Managed Care – PPO | Admitting: Lab

## 2012-01-03 DIAGNOSIS — C50919 Malignant neoplasm of unspecified site of unspecified female breast: Secondary | ICD-10-CM

## 2012-01-03 DIAGNOSIS — Z79810 Long term (current) use of selective estrogen receptor modulators (SERMs): Secondary | ICD-10-CM

## 2012-01-03 LAB — CBC WITH DIFFERENTIAL/PLATELET
Basophils Absolute: 0 10*3/uL (ref 0.0–0.1)
EOS%: 1.9 % (ref 0.0–7.0)
Eosinophils Absolute: 0.1 10*3/uL (ref 0.0–0.5)
HCT: 42.9 % (ref 34.8–46.6)
HGB: 14.7 g/dL (ref 11.6–15.9)
MCH: 31.4 pg (ref 25.1–34.0)
MCV: 91.5 fL (ref 79.5–101.0)
MONO%: 8.1 % (ref 0.0–14.0)
NEUT#: 3.1 10*3/uL (ref 1.5–6.5)
NEUT%: 60.3 % (ref 38.4–76.8)
RDW: 13.7 % (ref 11.2–14.5)
lymph#: 1.5 10*3/uL (ref 0.9–3.3)

## 2012-01-03 LAB — COMPREHENSIVE METABOLIC PANEL
AST: 25 U/L (ref 0–37)
Albumin: 4.8 g/dL (ref 3.5–5.2)
BUN: 14 mg/dL (ref 6–23)
Calcium: 9.8 mg/dL (ref 8.4–10.5)
Chloride: 99 mEq/L (ref 96–112)
Creatinine, Ser: 1.05 mg/dL (ref 0.50–1.10)
Glucose, Bld: 119 mg/dL — ABNORMAL HIGH (ref 70–99)
Potassium: 3.7 mEq/L (ref 3.5–5.3)

## 2012-01-03 MED ORDER — TAMOXIFEN CITRATE 20 MG PO TABS: 20.0000 mg | ORAL_TABLET | Freq: Every day | ORAL | Status: AC

## 2012-01-03 MED ORDER — TAMOXIFEN CITRATE 20 MG PO TABS: 20.0000 mg | ORAL_TABLET | Freq: Every day | ORAL | Status: DC

## 2012-01-03 MED ORDER — OXYCODONE HCL 10 MG PO TABS: 10.0000 mg | ORAL_TABLET | ORAL | Status: AC | PRN

## 2012-01-06 NOTE — Progress Notes (Signed)
OFFICE PROGRESS NOTE  CC Dr. Annice Pih, MD, MD [phone] S. [address] [place] [address] Dr. Lurline Hare  DIAGNOSIS: 46 year old female with stage III (T4 N2) invasive lobular carcinoma of the left breast. Patient is status post lumpectomy with axillary lymph node dissection or  PRIOR THERAPY:  #1 patient initially underwent a central lumpectomy with axillary lymph node dissection.  #2 she then underwent dose dense adjuvant chemotherapy initially consisting of 4 cycles of a.c. given from 05/25/2011 2 07/16/2011.  #3 she then received 12 weeks of single agent Taxol completed on 10/18/2011.  #4 she has now completed radiation therapy to the left breast on 12/13/2011.  #5 patient has begun tamoxifen 20 mg adjuvantly about 10 days ago.  CURRENT THERAPY: Tamoxifen 20 mg daily 12/23/2011.  INTERVAL HISTORY: [name] 46 y.o. female returns for followup visit today. Overall she is doing well. So far patient is tolerating the tamoxifen well. However she has only been on this for about 10 days now. She denies any fevers chills night sweats headaches shortness of breath chest pains palpitations she denies having any myalgias or arthralgias. She has no vaginal discharge or bleeding no visual disturbances. She is experiencing hot flashes little bit more than usual. Remainder of the 10 point review of systems is negative her  MEDICAL HISTORY: Past Medical History  Diagnosis Date  . Hypertension   . Cancer     basal cell - skin  . Breast cancer, stage 4 dx'd 03/12/11    left  . Breast cancer, stage 3 10/18/2011  . Radiation adverse effect 12/02/2011    ALLERGIES:  is allergic to latex.  MEDICATIONS:  Reviewed and recorded  SURGICAL HISTORY:  Past Surgical History  Procedure Date  . Lipoma excision 5 years    upper back  . Mastectomy   . Mastectomy partial / lumpectomy   . Lymph node dissection   . Axillary lymph node dissection   . Dilation and  curettage of uterus   . Portacath placement     REVIEW OF SYSTEMS:  Pertinent items are noted in HPI.   PHYSICAL EXAMINATION: General appearance: alert, cooperative and appears stated age Head: Normocephalic, without obvious abnormality, atraumatic Neck: no adenopathy, no carotid bruit, no JVD, supple, symmetrical, trachea midline and thyroid not enlarged, symmetric, no tenderness/mass/nodules Lymph nodes: Cervical, supraclavicular, and axillary nodes normal. Resp: clear to auscultation bilaterally and normal percussion bilaterally Back: symmetric, no curvature. ROM normal. No CVA tenderness. Cardio: regular rate and rhythm, S1, S2 normal, no murmur, click, rub or gallop GI: soft, non-tender; bowel sounds normal; no masses,  no organomegaly Extremities: extremities normal, atraumatic, no cyanosis or edema Neurologic: Alert and oriented X 3, normal strength and tone. Normal symmetric reflexes. Normal coordination and gait  ECOG PERFORMANCE STATUS: 1 - Symptomatic but completely ambulatory  Blood pressure 163/103, pulse 90, temperature 97.9 F (36.6 C), height 5' 4.5" (1.638 m), weight 148 lb 12.8 oz (67.495 kg).  LABORATORY DATA: Lab Results  Component Value Date   WBC 5.2 01/03/2012   HGB 14.7 01/03/2012   HCT 42.9 01/03/2012   MCV 91.5 01/03/2012   PLT 224 01/03/2012      Chemistry      Component Value Date/Time   NA 140 01/03/2012 1306   K 3.7 01/03/2012 1306   CL 99 01/03/2012 1306   CO2 28 01/03/2012 1306   BUN 14 01/03/2012 1306   CREATININE 1.05 01/03/2012 1306      Component Value Date/Time  CALCIUM 9.8 01/03/2012 1306   ALKPHOS 56 01/03/2012 1306   AST 25 01/03/2012 1306   ALT 25 01/03/2012 1306   BILITOT 0.5 01/03/2012 1306       RADIOGRAPHIC STUDIES:  No results found.  ASSESSMENT: 46 year old female with  #1 stage III (T4 N2) invasive lobular carcinoma of the left breast status post lumpectomy with axillary lymph node dissection. This was then followed by 4  cycles of dose dense a.c. completed on 07/16/2011. She is also status post 12 weeks of single agent Taxol completed 10/18/2011. She completed her radiation therapy to the left breast on 12/13/2011. On January 7 she started tamoxifen 20 mg daily. She is tolerating it well so far.   PLAN: Patient will continue tamoxifen 20 mg daily. She will be seen back in 4-6 months time for followup. She has followup with Dr. Michell Heinrich as well as Dr. Vick Frees or in the future. She knows to call me with any problems   All questions were answered. The patient knows to call the clinic with any problems, questions or concerns. We can certainly see the patient much sooner if necessary.  I spent 25 minutes counseling the patient face to face. The total time spent in the appointment was 30 minutes.    Drue Second, MD Medical/Oncology [facility] [phone] (beeper) [phone] (Office)  01/06/2012, 10:25 PM

## 2012-01-09 ENCOUNTER — Encounter (INDEPENDENT_AMBULATORY_CARE_PROVIDER_SITE_OTHER): Payer: Self-pay | Admitting: General Surgery

## 2012-02-04 ENCOUNTER — Other Ambulatory Visit: Payer: Self-pay | Admitting: Radiation Oncology

## 2012-02-04 DIAGNOSIS — Z853 Personal history of malignant neoplasm of breast: Secondary | ICD-10-CM

## 2012-02-05 ENCOUNTER — Other Ambulatory Visit: Payer: Self-pay | Admitting: *Deleted

## 2012-02-05 MED ORDER — OXYCODONE HCL 10 MG PO TB12: 10.0000 mg | ORAL_TABLET | Freq: Two times a day (BID) | ORAL | Status: AC

## 2012-04-09 ENCOUNTER — Other Ambulatory Visit: Payer: Self-pay | Admitting: Medical Oncology

## 2012-04-09 DIAGNOSIS — C50919 Malignant neoplasm of unspecified site of unspecified female breast: Secondary | ICD-10-CM

## 2012-04-10 ENCOUNTER — Ambulatory Visit (HOSPITAL_BASED_OUTPATIENT_CLINIC_OR_DEPARTMENT_OTHER): Payer: BC Managed Care – PPO | Admitting: Oncology

## 2012-04-10 ENCOUNTER — Encounter: Payer: Self-pay | Admitting: Oncology

## 2012-04-10 ENCOUNTER — Other Ambulatory Visit (HOSPITAL_BASED_OUTPATIENT_CLINIC_OR_DEPARTMENT_OTHER): Payer: BC Managed Care – PPO | Admitting: Lab

## 2012-04-10 ENCOUNTER — Telehealth: Payer: Self-pay | Admitting: Oncology

## 2012-04-10 VITALS — BP 106/72 | HR 92 | Temp 98.5°F | Ht 64.5 in | Wt 142.4 lb

## 2012-04-10 DIAGNOSIS — C50919 Malignant neoplasm of unspecified site of unspecified female breast: Secondary | ICD-10-CM

## 2012-04-10 DIAGNOSIS — G8929 Other chronic pain: Secondary | ICD-10-CM

## 2012-04-10 DIAGNOSIS — IMO0001 Reserved for inherently not codable concepts without codable children: Secondary | ICD-10-CM

## 2012-04-10 DIAGNOSIS — F411 Generalized anxiety disorder: Secondary | ICD-10-CM

## 2012-04-10 DIAGNOSIS — M2550 Pain in unspecified joint: Secondary | ICD-10-CM

## 2012-04-10 LAB — CBC WITH DIFFERENTIAL/PLATELET
BASO%: 0.2 % (ref 0.0–2.0)
EOS%: 2.8 % (ref 0.0–7.0)
MCH: 31.8 pg (ref 25.1–34.0)
MCHC: 33.3 g/dL (ref 31.5–36.0)
RBC: 4.34 10*6/uL (ref 3.70–5.45)
RDW: 13 % (ref 11.2–14.5)
lymph#: 1.3 10*3/uL (ref 0.9–3.3)

## 2012-04-10 LAB — COMPREHENSIVE METABOLIC PANEL
ALT: 24 U/L (ref 0–35)
AST: 23 U/L (ref 0–37)
Albumin: 4.3 g/dL (ref 3.5–5.2)
Calcium: 9.8 mg/dL (ref 8.4–10.5)
Chloride: 99 mEq/L (ref 96–112)
Potassium: 3.6 mEq/L (ref 3.5–5.3)

## 2012-04-10 MED ORDER — OXYCODONE-ACETAMINOPHEN 5-325 MG PO TABS: 1.0000 | ORAL_TABLET | ORAL | Status: AC | PRN

## 2012-04-10 NOTE — Telephone Encounter (Signed)
gve the pt her nov 2013 appt calendar °

## 2012-04-10 NOTE — Patient Instructions (Signed)
1. Continue tamoxifen 20 mg daily.  2. Take percocet as needed for pain  3. Please get your mammogram and you can take a pain pill prior to your mammogram to avoid pain if necessary  4. I will see you back in 6 months

## 2012-04-10 NOTE — Progress Notes (Signed)
OFFICE PROGRESS NOTE  CC Dr. Annice Pih, MD, MD [phone] S. [address] [place] [address] Dr. Lurline Hare  DIAGNOSIS: 46 year old female with stage III (T4 N2) invasive lobular carcinoma of the left breast. Patient is status post lumpectomy with axillary lymph node dissection or  PRIOR THERAPY:  #1 patient initially underwent a central lumpectomy with axillary lymph node dissection.  #2 she then underwent dose dense adjuvant chemotherapy initially consisting of 4 cycles of a.c. given from 05/25/2011 2 07/16/2011.  #3 she then received 12 weeks of single agent Taxol completed on 10/18/2011.  #4 she has now completed radiation therapy to the left breast on 12/13/2011.  #5 patient has begun tamoxifen 20 mg adjuvantly about 10 days ago.  CURRENT THERAPY: Tamoxifen 20 mg daily 12/23/2011.  INTERVAL HISTORY: [name] 46 y.o. female returns for followup visit today.Patient returns in followup at 3 months. She is having some aches and pains in her hips. She has stiffness when she wakes up in the morning but it resolves as time goes on. She has significant amount of anxiety she is worried about her left breast because of the pain. She has not noticed any skin changes. She feels that she should have come back to normal since she has all of her chemotherapy. She is tolerating the tamoxifen well she has not had any vaginal bleeding she is going through menopause she does not like it and wants to not have to have anything to do with this. Again patient is significantly anxious she almost 2.0 mean manic. She has a lot going on at home. Cheyenne Adas and granddaughter are living with them. Patient has no nausea no vomiting no fevers no chills or night sweats no headaches no double vision. No bleeding or bruising. Remainder of the 10 point review of systems is negative.  MEDICAL HISTORY: Past Medical History  Diagnosis Date  . Hypertension   . Cancer     basal cell - skin  .  Breast cancer, stage 4 dx'd 03/12/11    left  . Breast cancer, stage 3 10/18/2011  . Radiation adverse effect 12/02/2011    ALLERGIES:  is allergic to bactrim and latex.  MEDICATIONS:  Reviewed and recorded  SURGICAL HISTORY:  Past Surgical History  Procedure Date  . Lipoma excision 5 years    upper back  . Mastectomy   . Mastectomy partial / lumpectomy   . Lymph node dissection   . Axillary lymph node dissection   . Dilation and curettage of uterus   . Portacath placement     REVIEW OF SYSTEMS:  Pertinent items are noted in HPI.   PHYSICAL EXAMINATION: General appearance: alert, cooperative and appears stated age Head: Normocephalic, without obvious abnormality, atraumatic Neck: no adenopathy, no carotid bruit, no JVD, supple, symmetrical, trachea midline and thyroid not enlarged, symmetric, no tenderness/mass/nodules Lymph nodes: Cervical, supraclavicular, and axillary nodes normal. Resp: clear to auscultation bilaterally and normal percussion bilaterally Back: symmetric, no curvature. ROM normal. No CVA tenderness. Cardio: regular rate and rhythm, S1, S2 normal, no murmur, click, rub or gallop GI: soft, non-tender; bowel sounds normal; no masses,  no organomegaly Extremities: extremities normal, atraumatic, no cyanosis or edema Neurologic: Alert and oriented X 3, normal strength and tone. Normal symmetric reflexes. Normal coordination and gait  ECOG PERFORMANCE STATUS: 1 - Symptomatic but completely ambulatory  Blood pressure 106/72, pulse 92, temperature 98.5 F (36.9 C), temperature source Oral, height 5' 4.5" (1.638 m), weight 142 lb 6.4 oz (64.592  kg).  LABORATORY DATA: Lab Results  Component Value Date   WBC 4.0 04/10/2012   HGB 13.8 04/10/2012   HCT 41.4 04/10/2012   MCV 95.3 04/10/2012   PLT 236 04/10/2012      Chemistry      Component Value Date/Time   NA 140 01/03/2012 1306   K 3.7 01/03/2012 1306   CL 99 01/03/2012 1306   CO2 28 01/03/2012 1306   BUN 14  01/03/2012 1306   CREATININE 1.05 01/03/2012 1306      Component Value Date/Time   CALCIUM 9.8 01/03/2012 1306   ALKPHOS 56 01/03/2012 1306   AST 25 01/03/2012 1306   ALT 25 01/03/2012 1306   BILITOT 0.5 01/03/2012 1306       RADIOGRAPHIC STUDIES:  No results found.  ASSESSMENT: 46 year old female with  #1 stage III (T4 N2) invasive lobular carcinoma of the left breast status post lumpectomy with axillary lymph node dissection. This was then followed by 4 cycles of dose dense a.c. completed on 07/16/2011. She is also status post 12 weeks of single agent Taxol completed 10/18/2011. She completed her radiation therapy to the left breast on 12/13/2011. On January 7 she started tamoxifen 20 mg daily. She is tolerating it well so far.  #2 myalgias and arthralgias  #3 anxiety  #4 patient had a mammogram scheduled but she decided not to have it done because of the pain she would potentially experience.  PLAN:  #1 patient will continue tamoxifen at this point I will see her every 6 months time.  #2 patient is given a prescription for Percocet as this is the best medication for her.  #3 we discussed anxiety management including her doing medication yoga.  #4 I have recommended that patient proceed with a mammogram on a yearly basis. She certainly can use pain medications if she has excessive pain while getting her mammograms.  #5 I will see her every 6 months.  All questions were answered. The patient knows to call the clinic with any problems, questions or concerns. We can certainly see the patient much sooner if necessary.  I spent 25 minutes counseling the patient face to face. The total time spent in the appointment was 30 minutes.    Drue Second, MD Medical/Oncology [facility] [phone] (beeper) [phone] (Office)  04/10/2012, 11:23 AM

## 2012-04-16 ENCOUNTER — Telehealth: Payer: Self-pay | Admitting: Medical Oncology

## 2012-04-16 NOTE — Telephone Encounter (Signed)
Per Dr. Welton Flakes, notified patient that lab work looks good.  Patient expressed understanding and aware of next appointment with Dr. Welton Flakes

## 2012-04-29 ENCOUNTER — Other Ambulatory Visit: Payer: Self-pay | Admitting: *Deleted

## 2012-04-29 DIAGNOSIS — C50919 Malignant neoplasm of unspecified site of unspecified female breast: Secondary | ICD-10-CM

## 2012-04-29 MED ORDER — ALPRAZOLAM 1 MG PO TABS: 1.0000 mg | ORAL_TABLET | Freq: Every evening | ORAL | Status: AC | PRN

## 2012-05-01 ENCOUNTER — Encounter: Payer: Self-pay | Admitting: Oncology

## 2012-05-05 NOTE — Progress Notes (Signed)
Dondra Spry called and said her husband's FMLA papers had to be in by 05/03/12.  Papers were faxed to [employer] [phone].  Her husband had not completed or signed his portion.  I mailed the original to their house.

## 2012-05-22 ENCOUNTER — Telehealth: Payer: Self-pay | Admitting: Medical Oncology

## 2012-05-22 NOTE — Telephone Encounter (Signed)
Received call from patient stating "I am dizzy and my pharmacist said it could be a build up from the Tamoxifen so I just wanted to talk to Dr. Welton Flakes about this?"  Will review with MD  Spoke with patient regarding dizziness and Tamoxifen.  Advised her per MD to hold Tamoxifen over the weekend and to call office back on Monday.  Patient expressed understanding.  Also educated patient on decreasing dizziness when changing positions- moving to edge of chair and waiting 10-15 seconds before standing, changing positions slowly.  Advised patient that if conditions become worse-blurred vision, vomiting, passing out to report to the emergency room.  Patient expressed understanding and repeated instructions back to caller.  No further questions at this time.

## 2012-05-25 ENCOUNTER — Telehealth: Payer: Self-pay | Admitting: Medical Oncology

## 2012-05-25 NOTE — Telephone Encounter (Signed)
Patient called and stated " I stopped taking the Tamoxifen on Friday per Dr. Welton Flakes.  My dizziness persisted through Friday, Saturday it was 90% better, and Sunday and today I am fine, no dizziness.  Should I start taking the Tamoxifen again or can we change it to something else?"  Will review with MD

## 2012-05-25 NOTE — Telephone Encounter (Signed)
LMOVM per MD, patient is to restart her Tamoxifen.  If any further questions please give office a call back.

## 2012-05-25 NOTE — Telephone Encounter (Signed)
Restart the tamoxifen

## 2012-06-11 ENCOUNTER — Other Ambulatory Visit: Payer: Self-pay | Admitting: Internal Medicine

## 2012-06-11 DIAGNOSIS — Z9010 Acquired absence of unspecified breast and nipple: Secondary | ICD-10-CM

## 2012-06-11 DIAGNOSIS — Z853 Personal history of malignant neoplasm of breast: Secondary | ICD-10-CM

## 2012-06-22 ENCOUNTER — Other Ambulatory Visit: Payer: BC Managed Care – PPO

## 2012-06-24 ENCOUNTER — Ambulatory Visit
Admission: RE | Admit: 2012-06-24 | Discharge: 2012-06-24 | Disposition: A | Discharge status: Home / Self Care | Payer: BC Managed Care – PPO | Source: Ambulatory Visit | Attending: Internal Medicine | Admitting: Internal Medicine

## 2012-06-24 DIAGNOSIS — Z9010 Acquired absence of unspecified breast and nipple: Secondary | ICD-10-CM

## 2012-06-24 DIAGNOSIS — Z853 Personal history of malignant neoplasm of breast: Secondary | ICD-10-CM

## 2012-07-03 ENCOUNTER — Telehealth: Payer: Self-pay | Admitting: *Deleted

## 2012-07-03 MED ORDER — UNABLE TO FIND: Status: DC

## 2012-07-03 NOTE — Telephone Encounter (Signed)
Pt called states " I need a Prothesis rx sent ot [facility] in [place] fax [phone]. I also need a refill on oxycodone 5/325mg , and I was dizzy they told me to stop tamoxifen, didn't get dizzy spells, then started back on it and now the dizzy spells are back. Is this going to continue while I'm on this medicine? Please ask Dr. Welton Flakes what I'm suppose to do about the dizzy spells." Will forward concerns to MD

## 2012-07-03 NOTE — Telephone Encounter (Signed)
Per MD, Rx for Breast Prothesis. Rx faxed to [facility] pharmacy [phone] per pt request.

## 2012-07-06 ENCOUNTER — Telehealth: Payer: Self-pay | Admitting: Medical Oncology

## 2012-07-06 ENCOUNTER — Other Ambulatory Visit: Payer: Self-pay | Admitting: Medical Oncology

## 2012-07-06 MED ORDER — HYDROCODONE-ACETAMINOPHEN 5-500 MG PO CAPS: 1.0000 | ORAL_CAPSULE | Freq: Four times a day (QID) | ORAL | Status: DC | PRN

## 2012-07-06 MED ORDER — OXYCODONE-ACETAMINOPHEN 5-325 MG PO TABS: 1.0000 | ORAL_TABLET | ORAL | Status: AC | PRN

## 2012-07-06 NOTE — Telephone Encounter (Signed)
Keep taking tamoxifen, she should be evaluated by her PCP for dizziness

## 2012-07-06 NOTE — Telephone Encounter (Signed)
LMOVM, per MD, patient to continue taking Tamoxifen and follow up with primary care doctor regarding dizziness.  Instructed patient to call with any questions or concerns

## 2012-07-06 NOTE — Telephone Encounter (Signed)
Patient inquiring whether it is ok to continue taking her Tamoxifen or should she stop.  C/O being dizzy.

## 2012-09-07 ENCOUNTER — Telehealth (INDEPENDENT_AMBULATORY_CARE_PROVIDER_SITE_OTHER): Payer: Self-pay

## 2012-09-07 NOTE — Telephone Encounter (Signed)
Pt is scheduled to see Dr. Abbey Chatters in one week to discuss referral to plastic surgery for reconstruction.

## 2012-09-11 ENCOUNTER — Other Ambulatory Visit: Payer: Self-pay | Admitting: Oncology

## 2012-09-11 DIAGNOSIS — C50919 Malignant neoplasm of unspecified site of unspecified female breast: Secondary | ICD-10-CM

## 2012-09-13 ENCOUNTER — Other Ambulatory Visit: Payer: Self-pay | Admitting: Oncology

## 2012-09-14 ENCOUNTER — Other Ambulatory Visit: Payer: Self-pay | Admitting: Oncology

## 2012-09-14 ENCOUNTER — Telehealth: Payer: Self-pay | Admitting: *Deleted

## 2012-09-14 ENCOUNTER — Ambulatory Visit (INDEPENDENT_AMBULATORY_CARE_PROVIDER_SITE_OTHER): Payer: BC Managed Care – PPO | Admitting: General Surgery

## 2012-09-14 ENCOUNTER — Encounter (INDEPENDENT_AMBULATORY_CARE_PROVIDER_SITE_OTHER): Payer: Self-pay | Admitting: General Surgery

## 2012-09-14 VITALS — BP 108/64 | HR 76 | Temp 97.3°F | Resp 16 | Ht 64.0 in | Wt 142.8 lb

## 2012-09-14 DIAGNOSIS — F419 Anxiety disorder, unspecified: Secondary | ICD-10-CM

## 2012-09-14 DIAGNOSIS — Z853 Personal history of malignant neoplasm of breast: Secondary | ICD-10-CM

## 2012-09-14 MED ORDER — ALPRAZOLAM 1 MG PO TABS: 1.0000 mg | ORAL_TABLET | Freq: Three times a day (TID) | ORAL | Status: DC | PRN

## 2012-09-14 NOTE — Patient Instructions (Signed)
We will make a referral to the Plastic Surgeon for you.

## 2012-09-14 NOTE — Progress Notes (Signed)
Procedure:  Left partial mastectomy and sentinel lymph node biopsy  04/09/2011; left axillary lymph node dissection 04/23/2011.  Pathology:  Stage III  History:  She is here for long-term follow up of her left breast cancer. She has some breast asymmetry and would like to see a plastic surgeon to talk about reconstruction. No lymphedema. No new breast masses. No adenopathy. She is taking tamoxifen and having some lower extremity muscular aching from that. She takes Percocet as needed for this.  Exam: Gen.-she looks well and is in no acute distress.  Right breast-no dominant masses or suspicious skin changes.  Left breast-well-healed midportion scar with absence of nipple. No dominant masses palpable.  Lymph nodes-no palpable cervical, supraclavicular, or axillary adenopathy.  Extremities-no left upper extremity lymphedema.  Mammogram in July and ultrasound did not demonstrate any suspicious lesions.  Assessment:  Stage III left breast cancer status post left partial mastectomy and axillary lymph node dissection-no clinical evidence of recurrence; she does have some breast asymmetry.  Plan:  Referral to a plastic surgeon. Return visit 4 months.

## 2012-09-24 ENCOUNTER — Other Ambulatory Visit: Payer: Self-pay | Admitting: Emergency Medicine

## 2012-09-24 ENCOUNTER — Ambulatory Visit (HOSPITAL_COMMUNITY)
Admission: RE | Admit: 2012-09-24 | Discharge: 2012-09-24 | Disposition: A | Discharge status: Home / Self Care | Payer: BC Managed Care – PPO | Source: Ambulatory Visit | Attending: Oncology | Admitting: Oncology

## 2012-09-24 DIAGNOSIS — C50919 Malignant neoplasm of unspecified site of unspecified female breast: Secondary | ICD-10-CM | POA: Insufficient documentation

## 2012-09-24 DIAGNOSIS — Z923 Personal history of irradiation: Secondary | ICD-10-CM | POA: Insufficient documentation

## 2012-09-24 LAB — POCT I-STAT, CHEM 8
Calcium, Ion: 1.23 mmol/L (ref 1.12–1.23)
Chloride: 102 mEq/L (ref 96–112)
Glucose, Bld: 78 mg/dL (ref 70–99)
HCT: 43 % (ref 36.0–46.0)
TCO2: 29 mmol/L (ref 0–100)

## 2012-09-24 MED ORDER — OXYCODONE-ACETAMINOPHEN 5-325 MG PO TABS: 1.0000 | ORAL_TABLET | ORAL | Status: DC | PRN

## 2012-09-24 MED ORDER — GADOBENATE DIMEGLUMINE 529 MG/ML IV SOLN
15.0000 mL | Freq: Once | INTRAVENOUS | Status: AC | PRN
  Administered 2012-09-24: 13 mL via INTRAVENOUS

## 2012-10-16 ENCOUNTER — Telehealth: Payer: Self-pay | Admitting: *Deleted

## 2012-10-16 ENCOUNTER — Encounter: Payer: Self-pay | Admitting: Adult Health

## 2012-10-16 ENCOUNTER — Other Ambulatory Visit (HOSPITAL_BASED_OUTPATIENT_CLINIC_OR_DEPARTMENT_OTHER): Payer: BC Managed Care – PPO | Admitting: Lab

## 2012-10-16 ENCOUNTER — Telehealth: Payer: Self-pay | Admitting: Oncology

## 2012-10-16 ENCOUNTER — Ambulatory Visit (HOSPITAL_BASED_OUTPATIENT_CLINIC_OR_DEPARTMENT_OTHER): Payer: BC Managed Care – PPO | Admitting: Adult Health

## 2012-10-16 VITALS — BP 144/89 | HR 62 | Temp 98.8°F | Resp 20 | Ht 64.0 in | Wt 144.4 lb

## 2012-10-16 DIAGNOSIS — C773 Secondary and unspecified malignant neoplasm of axilla and upper limb lymph nodes: Secondary | ICD-10-CM

## 2012-10-16 DIAGNOSIS — F419 Anxiety disorder, unspecified: Secondary | ICD-10-CM

## 2012-10-16 DIAGNOSIS — IMO0001 Reserved for inherently not codable concepts without codable children: Secondary | ICD-10-CM

## 2012-10-16 DIAGNOSIS — C50919 Malignant neoplasm of unspecified site of unspecified female breast: Secondary | ICD-10-CM

## 2012-10-16 LAB — CBC WITH DIFFERENTIAL/PLATELET
Eosinophils Absolute: 0.1 10*3/uL (ref 0.0–0.5)
MONO#: 0.4 10*3/uL (ref 0.1–0.9)
NEUT#: 2.9 10*3/uL (ref 1.5–6.5)
RBC: 4.29 10*6/uL (ref 3.70–5.45)
RDW: 12.6 % (ref 11.2–14.5)
WBC: 5.2 10*3/uL (ref 3.9–10.3)

## 2012-10-16 LAB — COMPREHENSIVE METABOLIC PANEL (CC13)
ALT: 24 U/L (ref 0–55)
CO2: 29 mEq/L (ref 22–29)
Calcium: 10.1 mg/dL (ref 8.4–10.4)
Chloride: 103 mEq/L (ref 98–107)
Sodium: 140 mEq/L (ref 136–145)
Total Bilirubin: 0.47 mg/dL (ref 0.20–1.20)
Total Protein: 7.2 g/dL (ref 6.4–8.3)

## 2012-10-16 MED ORDER — ALPRAZOLAM 1 MG PO TABS: 1.0000 mg | ORAL_TABLET | Freq: Three times a day (TID) | ORAL | Status: DC | PRN

## 2012-10-16 NOTE — Telephone Encounter (Signed)
Gave patient appointment for 10-20-2012 with dr.bowers at 3:00pm  Gave patient appointment for six months in 2014

## 2012-10-16 NOTE — Patient Instructions (Addendum)
Doing well.  No sign of recurrence.  Try glucosamine chondroitin for your joint aches.  Please call us if you have any questions or concerns.

## 2012-10-16 NOTE — Progress Notes (Signed)
OFFICE PROGRESS NOTE  CC Dr. Annice Pih, MD [phone] S. [address] [place] [address] Dr. Lurline Hare  DIAGNOSIS: 46 year old female with stage III (T4 N2) invasive lobular carcinoma of the left breast. Patient is status post lumpectomy with axillary lymph node dissection or  PRIOR THERAPY:  #1 patient initially underwent a central lumpectomy with axillary lymph node dissection.  #2 she then underwent dose dense adjuvant chemotherapy initially consisting of 4 cycles of a.c. given from 05/25/2011 2 07/16/2011.  #3 she then received 12 weeks of single agent Taxol completed on 10/18/2011.  #4 she has now completed radiation therapy to the left breast on 12/13/2011.  #5 patient has begun tamoxifen 20 mg adjuvantly about 10 days ago.  CURRENT THERAPY: Tamoxifen 20 mg daily 12/23/2011.  INTERVAL HISTORY: [name] 46 y.o. female returns for followup visit today.  She is doing well.  She is continuing to take her tamoxifen.  She tolerates it relatively well, having aches and pains for which she uses percocet occasionally.  She did have an MRI of her breasts which was negative.  In reviewing her health maintenance with her, she would like a referral to a gynecologist and a Engineer, petroleum.    MEDICAL HISTORY: Past Medical History  Diagnosis Date  . Hypertension   . Cancer     basal cell - skin  . Breast cancer, stage 4 dx'd 03/12/11    left  . Breast cancer, stage 3 10/18/2011  . Radiation adverse effect 12/02/2011    ALLERGIES:  is allergic to bactrim and latex.  MEDICATIONS:  Reviewed and recorded  SURGICAL HISTORY:  Past Surgical History  Procedure Date  . Lipoma excision 5 years    upper back  . Mastectomy   . Mastectomy partial / lumpectomy   . Lymph node dissection   . Axillary lymph node dissection   . Dilation and curettage of uterus   . Portacath placement     REVIEW OF SYSTEMS:   General: fatigue (-), night sweats (-), fever  (-), pain (-) Lymph: palpable nodes (-) HEENT: vision changes (-), mucositis (-), gum bleeding (-), epistaxis (-) Cardiovascular: chest pain (-), palpitations (-) Pulmonary: shortness of breath (-), dyspnea on exertion (-), cough (-), hemoptysis (-) GI:  Early satiety (-), melena (-), dysphagia (-), nausea/vomiting (-), diarrhea (-) GU: dysuria (-), hematuria (-), incontinence (-) Musculoskeletal: joint swelling (-), joint pain (-), back pain (-) Neuro: weakness (-), numbness (-), headache (-), confusion (-) Skin: Rash (-), lesions (-), dryness (-) Psych: depression (-), suicidal/homicidal ideation (-), feeling of hopelessness (-)  Health Maintenance  Mammogram: 06/2012 Colonoscopy: n/a  Bone Density Scan: n/a Pap Smear: 3 years ago Eye Exam: 4 years ago Vitamin D Level: pending today's draw Lipid Panel: 3-4 years.   PHYSICAL EXAMINATION: BP 144/89  Pulse 62  Temp 98.8 F (37.1 C) (Oral)  Resp 20  Ht 5\' 4"  (1.626 m)  Wt 144 lb 6.4 oz (65.499 kg)  BMI 24.79 kg/m2 General: Patient is a well appearing female in no acute distress HEENT: PERRLA, sclerae anicteric no conjunctival pallor, MMM Neck: supple, no palpable adenopathy Lungs: clear to auscultation bilaterally, no wheezes, rhonchi, or rales Cardiovascular: regular rate rhythm, S1, S2, no murmurs, rubs or gallops Abdomen: Soft, non-tender, non-distended, normoactive bowel sounds, no HSM Extremities: warm and well perfused, no clubbing, cyanosis, or edema Skin: No rashes or lesions Neuro: Non-focal ECOG PERFORMANCE STATUS: 1 - Symptomatic but completely ambulatory    LABORATORY DATA: Lab Results  Component Value Date   WBC 5.2 10/16/2012   HGB 13.8 10/16/2012   HCT 40.3 10/16/2012   MCV 94.0 10/16/2012   PLT 192 10/16/2012      Chemistry      Component Value Date/Time   [name] 140 09/24/2012 1039   K 4.1 09/24/2012 1039   CL 102 09/24/2012 1039   CO2 31 04/10/2012 1018   BUN 21 09/24/2012 1039   CREATININE 1.10  09/24/2012 1039      Component Value Date/Time   CALCIUM 9.8 04/10/2012 1018   ALKPHOS 54 04/10/2012 1018   AST 23 04/10/2012 1018   ALT 24 04/10/2012 1018   BILITOT 0.4 04/10/2012 1018       RADIOGRAPHIC STUDIES:  No results found.  ASSESSMENT: 46 year old female with  #1 stage III (T4 N2) invasive lobular carcinoma of the left breast status post lumpectomy with axillary lymph node dissection. This was then followed by 4 cycles of dose dense a.c. completed on 07/16/2011. She is also status post 12 weeks of single agent Taxol completed 10/18/2011. She completed her radiation therapy to the left breast on 12/13/2011. On January 7 she started tamoxifen 20 mg daily. She is tolerating it well so far.  #2 myalgias and arthralgias    PLAN:  #1 Ms. Starn is doing well.  She has no sign of recurrence.  She will continue her Tamoxifen.    #2 Ms. Llerenas will continue her PRN percocet.  She is also going to try glucosamine/chondroitin.    #3 I referred Ms. [name] to see Dr. Odis Luster and Dr. Tamela Oddi.  We will see her back in 6 months time.    All questions were answered. The patient knows to call the clinic with any problems, questions or concerns. We can certainly see the patient much sooner if necessary.  I spent 25 minutes counseling the patient face to face. The total time spent in the appointment was 30 minutes.    Cherie Ouch Lyn Hollingshead, NP Medical Oncology [facility] Phone: [phone] 10/16/2012, 10:47 AM

## 2012-10-16 NOTE — Telephone Encounter (Signed)
Pt has the aPPT WITH DR Antionette Char AT THE [facility] ON 10/30/2012 ON [address]

## 2012-10-17 LAB — VITAMIN D 25 HYDROXY (VIT D DEFICIENCY, FRACTURES): Vit D, 25-Hydroxy: 25 ng/mL — ABNORMAL LOW (ref 30–89)

## 2012-10-22 ENCOUNTER — Telehealth: Payer: Self-pay | Admitting: *Deleted

## 2012-10-22 NOTE — Telephone Encounter (Signed)
Per NP, notified pt to speak with PCP regarding cholesterol check and Bone Density Scan. Pt verbalized understanding.

## 2012-10-22 NOTE — Telephone Encounter (Signed)
Per NP, instructed pt to begin Vitamin d 1000 units daily. Pt verbalized understanding. Pt also questioned if she needs her Cholesterol checked and a Bone Density Scan? Will review with Provider.

## 2012-10-22 NOTE — Telephone Encounter (Signed)
Message copied by Cooper Render on Thu Oct 22, 2012 10:13 AM ------      Message from: Augustin Schooling C      Created: Thu Oct 22, 2012  9:43 AM       Please call and ensure Vitamin d 1000 IU daily.  If already taking, increase to 2000.      ----- Message -----         From: [facility] Interface         Sent: 10/16/2012  10:33 AM           To: Victorino December, MD

## 2012-10-28 ENCOUNTER — Encounter: Payer: Self-pay | Admitting: *Deleted

## 2012-10-28 NOTE — Progress Notes (Signed)
Rx for Xanax written 09/14/12 not picked up after 45 days.  Prescription shredded.

## 2012-10-30 ENCOUNTER — Other Ambulatory Visit: Payer: Self-pay | Admitting: Obstetrics & Gynecology

## 2012-10-30 DIAGNOSIS — C50919 Malignant neoplasm of unspecified site of unspecified female breast: Secondary | ICD-10-CM

## 2012-10-30 DIAGNOSIS — E8941 Symptomatic postprocedural ovarian failure: Secondary | ICD-10-CM

## 2012-11-16 ENCOUNTER — Ambulatory Visit (HOSPITAL_COMMUNITY)
Admission: RE | Admit: 2012-11-16 | Discharge: 2012-11-16 | Disposition: A | Discharge status: Home / Self Care | Payer: BC Managed Care – PPO | Source: Ambulatory Visit | Attending: Obstetrics & Gynecology | Admitting: Obstetrics & Gynecology

## 2012-11-16 DIAGNOSIS — Z13820 Encounter for screening for osteoporosis: Secondary | ICD-10-CM | POA: Insufficient documentation

## 2012-11-16 DIAGNOSIS — Z79810 Long term (current) use of selective estrogen receptor modulators (SERMs): Secondary | ICD-10-CM | POA: Insufficient documentation

## 2012-11-16 DIAGNOSIS — E8941 Symptomatic postprocedural ovarian failure: Secondary | ICD-10-CM | POA: Insufficient documentation

## 2012-11-16 DIAGNOSIS — C50919 Malignant neoplasm of unspecified site of unspecified female breast: Secondary | ICD-10-CM

## 2012-12-14 ENCOUNTER — Encounter (INDEPENDENT_AMBULATORY_CARE_PROVIDER_SITE_OTHER): Payer: Self-pay | Admitting: General Surgery

## 2012-12-16 HISTORY — PX: PATELLA FRACTURE SURGERY: SHX735

## 2013-02-01 IMAGING — MG MM DIGITAL DIAGNOSTIC BILAT
7 series · 7 of 7 positions shown · non-contrast
Comparison: With priors

CLINICAL DATA: The patient complains of left nipple inversion and
palpable abnormality in the lower inner quadrant of the breast

DIGITAL DIAGNOSTIC BILATERAL MAMMOGRAM WITH CAD AND LEFT BREAST
ULTRASOUND:

[R CC]
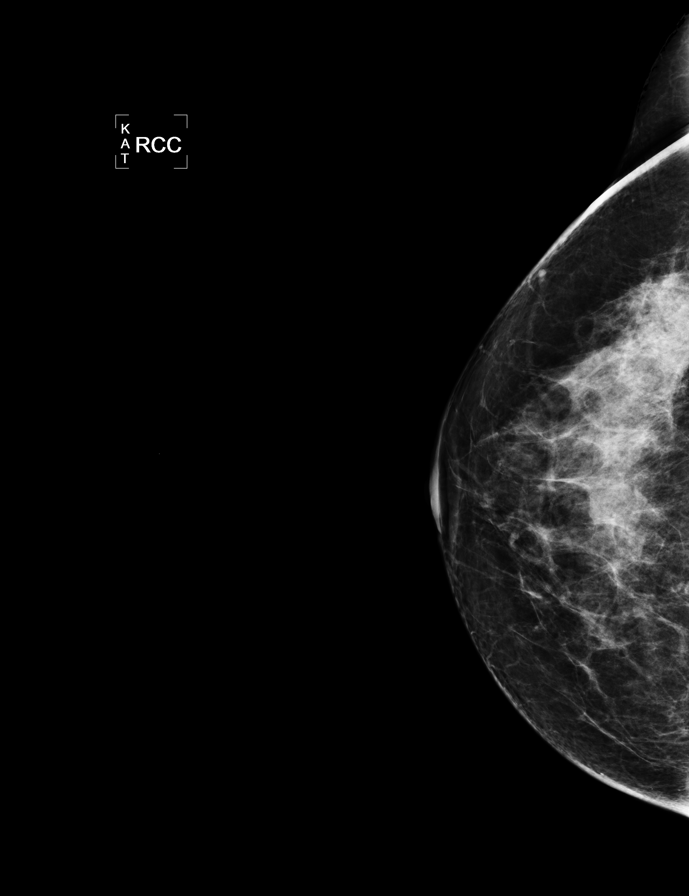

[L CC (1 of 2)]
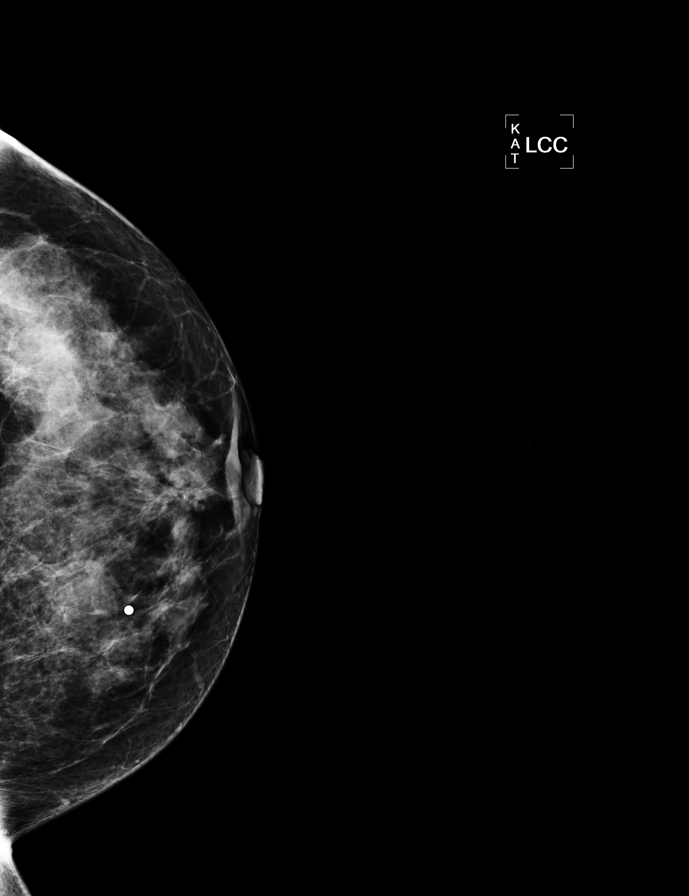

[L MLO]
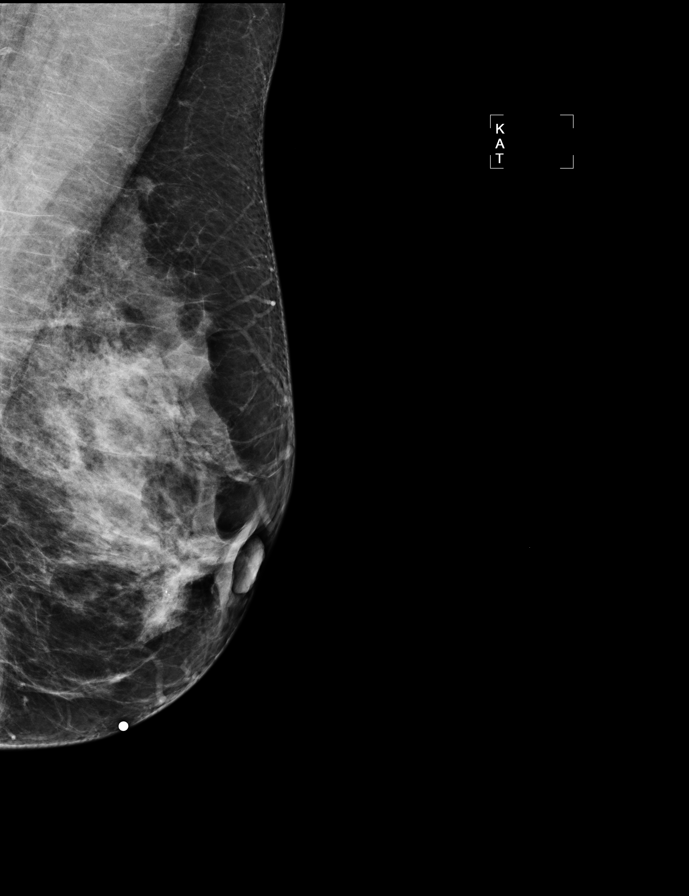

[R MLO]
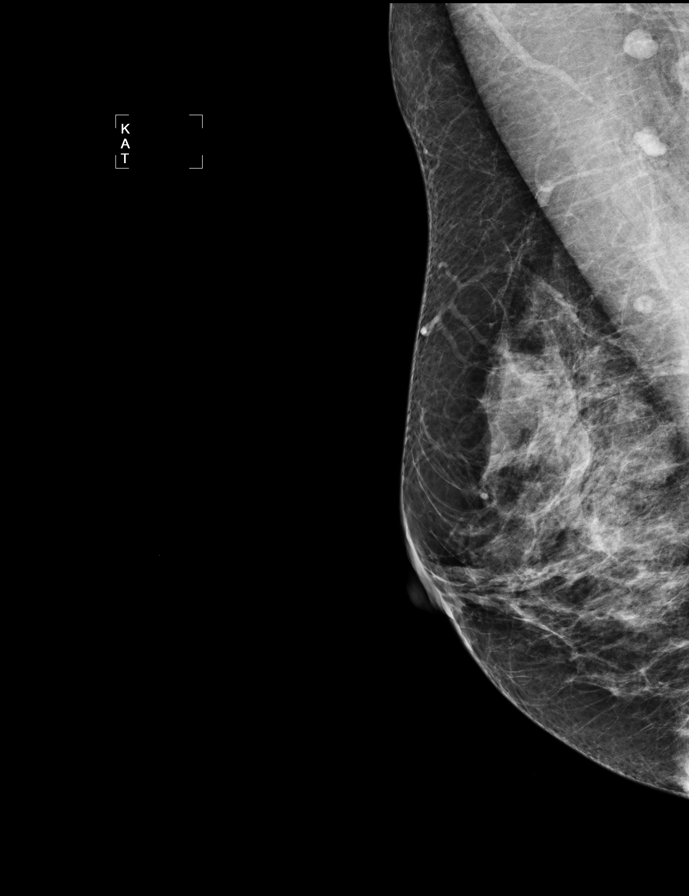

[L TAN]
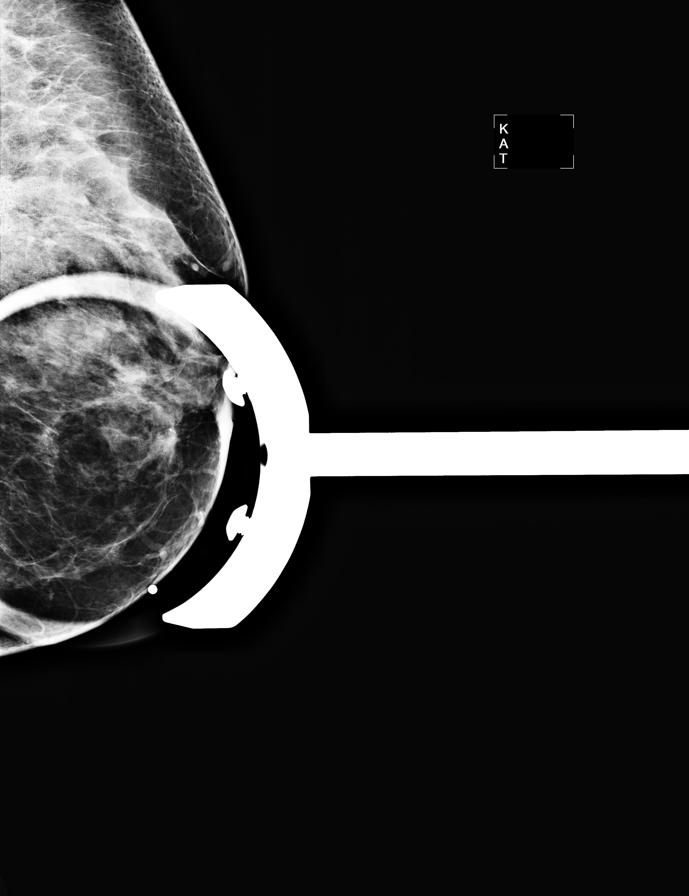

[L CC (2 of 2)]
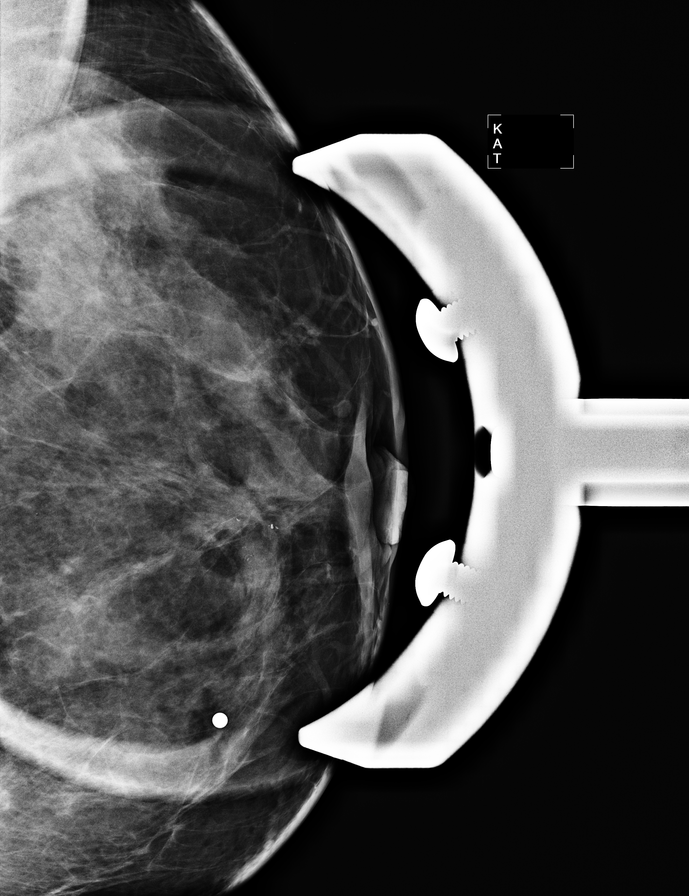

[L ML]
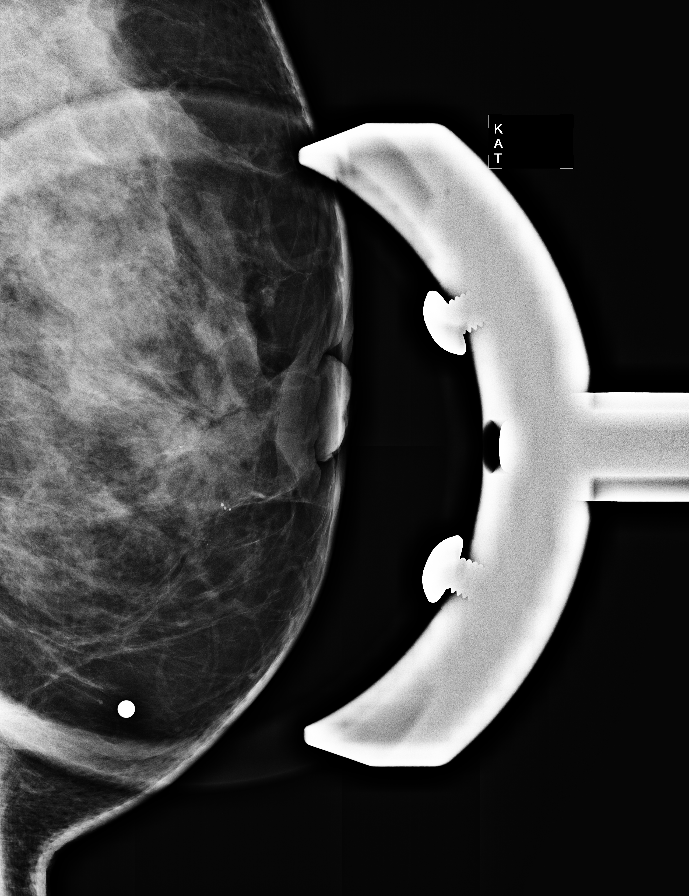

[7 of 7 positions shown; findings below may reference images not displayed]

FINDINGS: There is a dense fibroglandular pattern.  There is
developing distortion, density and several punctate calcifications
in the left subareolar region which is concerning.  In the area of
clinical concern in the lower inner quadrant of the left breast no
suspicious mass is identified.  The right breast is negative.
Mammographic images were processed with CAD.

On physical exam, I palpate a discrete thickening in the left
subareolar region.  I do not palpate a mass in the lower inner
quadrant of the left breast.

Ultrasound is performed, showing there is a discrete area of
shadowing in the 6 o'clock subareolar region of the left breast
measuring 5 mm.  Sonographic evaluation of the lower inner quadrant
of the left breast reveals normal tissue.  Sonographic evaluation
of the left axilla fails to reveal any enlarged adenopathy.
IMPRESSION: Suspicious findings in the subareolar region of the left breast.
Tissue sampling is recommended.  Ultrasound-guided core biopsy will
be performed and dictated separately.

BI-RADS CATEGORY 4:  Suspicious abnormality - biopsy should be
considered.

## 2013-02-19 ENCOUNTER — Other Ambulatory Visit: Payer: Self-pay | Admitting: Oncology

## 2013-03-19 ENCOUNTER — Encounter: Payer: Self-pay | Admitting: Oncology

## 2013-03-19 ENCOUNTER — Telehealth: Payer: Self-pay | Admitting: Oncology

## 2013-03-19 ENCOUNTER — Ambulatory Visit (HOSPITAL_BASED_OUTPATIENT_CLINIC_OR_DEPARTMENT_OTHER): Payer: BC Managed Care – PPO | Admitting: Oncology

## 2013-03-19 ENCOUNTER — Other Ambulatory Visit (HOSPITAL_BASED_OUTPATIENT_CLINIC_OR_DEPARTMENT_OTHER): Payer: BC Managed Care – PPO | Admitting: Lab

## 2013-03-19 VITALS — BP 144/97 | HR 78 | Temp 98.2°F | Resp 20 | Ht 64.0 in | Wt 143.0 lb

## 2013-03-19 DIAGNOSIS — C50019 Malignant neoplasm of nipple and areola, unspecified female breast: Secondary | ICD-10-CM

## 2013-03-19 DIAGNOSIS — IMO0001 Reserved for inherently not codable concepts without codable children: Secondary | ICD-10-CM

## 2013-03-19 DIAGNOSIS — C50912 Malignant neoplasm of unspecified site of left female breast: Secondary | ICD-10-CM

## 2013-03-19 DIAGNOSIS — C50919 Malignant neoplasm of unspecified site of unspecified female breast: Secondary | ICD-10-CM

## 2013-03-19 DIAGNOSIS — C773 Secondary and unspecified malignant neoplasm of axilla and upper limb lymph nodes: Secondary | ICD-10-CM

## 2013-03-19 DIAGNOSIS — M2550 Pain in unspecified joint: Secondary | ICD-10-CM

## 2013-03-19 LAB — CBC WITH DIFFERENTIAL/PLATELET
BASO%: 1 % (ref 0.0–2.0)
EOS%: 1.4 % (ref 0.0–7.0)
HCT: 39 % (ref 34.8–46.6)
LYMPH%: 32.5 % (ref 14.0–49.7)
MCH: 32 pg (ref 25.1–34.0)
MCHC: 34 g/dL (ref 31.5–36.0)
NEUT%: 56.8 % (ref 38.4–76.8)
Platelets: 193 10*3/uL (ref 145–400)
RBC: 4.15 10*6/uL (ref 3.70–5.45)
WBC: 5.4 10*3/uL (ref 3.9–10.3)
lymph#: 1.8 10*3/uL (ref 0.9–3.3)

## 2013-03-19 LAB — COMPREHENSIVE METABOLIC PANEL (CC13)
ALT: 28 U/L (ref 0–55)
AST: 27 U/L (ref 5–34)
Creatinine: 0.9 mg/dL (ref 0.6–1.1)
Sodium: 140 mEq/L (ref 136–145)
Total Bilirubin: 0.8 mg/dL (ref 0.20–1.20)
Total Protein: 7.3 g/dL (ref 6.4–8.3)

## 2013-03-19 NOTE — Patient Instructions (Addendum)
Continue tamoxifen daily  Go to [facility] on [address] for the left breast prosthesis  I will see you back in 6 months

## 2013-03-19 NOTE — Telephone Encounter (Signed)
, °

## 2013-03-19 NOTE — Progress Notes (Signed)
OFFICE PROGRESS NOTE  CC Dr. Annice Pih, MD [phone] S. [address] [place] [address] Dr. Lurline Campos  DIAGNOSIS: 47 year old female with stage III (T4 N2) invasive lobular carcinoma of the left breast. Patient is status post lumpectomy with axillary lymph node dissection or  PRIOR THERAPY:  #1 patient initially underwent a central lumpectomy with axillary lymph node dissection.  #2 she then underwent dose dense adjuvant chemotherapy initially consisting of 4 cycles of a.c. given from 05/25/2011 2 07/16/2011.  #3 she then received 12 weeks of single agent Taxol completed on 10/18/2011.  #4 she has now completed radiation therapy to the left breast on 12/13/2011.  #5 patient has begun tamoxifen 20 mg adjuvantly about 10 days ago.  CURRENT THERAPY: Tamoxifen 20 mg daily 12/23/2011.  INTERVAL HISTORY: [name] 47 y.o. female returns for followup visit today.  She is doing well.  She is continuing to take her tamoxifen.  She tolerates it relatively well, having aches and pains for which she uses percocet occasionally.  She did have an MRI of her breasts which was negative.  In reviewing her health maintenance with her, she would like a referral to a gynecologist and a Engineer, petroleum.    MEDICAL HISTORY: Past Medical History  Diagnosis Date  . Hypertension   . Cancer     basal cell - skin  . Breast cancer, stage 4 dx'd 03/12/11    left  . Breast cancer, stage 3 10/18/2011  . Radiation adverse effect 12/02/2011    ALLERGIES:  is allergic to bactrim and latex.  MEDICATIONS:  Reviewed and recorded  SURGICAL HISTORY:  Past Surgical History  Procedure Laterality Date  . Lipoma excision  5 years    upper back  . Mastectomy    . Mastectomy partial / lumpectomy    . Lymph node dissection    . Axillary lymph node dissection    . Dilation and curettage of uterus    . Portacath placement      REVIEW OF SYSTEMS:   General: fatigue (-), night  sweats (-), fever (-), pain (-) Lymph: palpable nodes (-) HEENT: vision changes (-), mucositis (-), gum bleeding (-), epistaxis (-) Cardiovascular: chest pain (-), palpitations (-) Pulmonary: shortness of breath (-), dyspnea on exertion (-), cough (-), hemoptysis (-) GI:  Early satiety (-), melena (-), dysphagia (-), nausea/vomiting (-), diarrhea (-) GU: dysuria (-), hematuria (-), incontinence (-) Musculoskeletal: joint swelling (-), joint pain (-), back pain (-) Neuro: weakness (-), numbness (-), headache (-), confusion (-) Skin: Rash (-), lesions (-), dryness (-) Psych: depression (-), suicidal/homicidal ideation (-), feeling of hopelessness (-)  Health Maintenance  Mammogram: 06/2012 Colonoscopy: n/a  Bone Density Scan: n/a Pap Smear: 3 years ago Eye Exam: 4 years ago Vitamin D Level: pending today's draw Lipid Panel: 3-4 years.   PHYSICAL EXAMINATION: BP 144/97  Pulse 78  Temp(Src) 98.2 F (36.8 C) (Oral)  Resp 20  Ht 5\' 4"  (1.626 m)  Wt 143 lb (64.864 kg)  BMI 24.53 kg/m2 General: Patient is a well appearing female in no acute distress HEENT: PERRLA, sclerae anicteric no conjunctival pallor, MMM Neck: supple, no palpable adenopathy Lungs: clear to auscultation bilaterally, no wheezes, rhonchi, or rales Cardiovascular: regular rate rhythm, S1, S2, no murmurs, rubs or gallops Abdomen: Soft, non-tender, non-distended, normoactive bowel sounds, no HSM Extremities: warm and well perfused, no clubbing, cyanosis, or edema Skin: No rashes or lesions Neuro: Non-focal ECOG PERFORMANCE STATUS: 1 - Symptomatic but completely ambulatory  LABORATORY DATA: Lab Results  Component Value Date   WBC 5.4 03/19/2013   HGB 13.3 03/19/2013   HCT 39.0 03/19/2013   MCV 94.1 03/19/2013   PLT 193 03/19/2013      Chemistry      Component Value Date/Time   NA 140 03/19/2013 1014   NA 140 09/24/2012 1039   K 4.0 03/19/2013 1014   K 4.1 09/24/2012 1039   CL 99 03/19/2013 1014   CL 102  09/24/2012 1039   CO2 28 03/19/2013 1014   CO2 31 04/10/2012 1018   BUN 15.5 03/19/2013 1014   BUN 21 09/24/2012 1039   CREATININE 0.9 03/19/2013 1014   CREATININE 1.10 09/24/2012 1039      Component Value Date/Time   CALCIUM 9.4 03/19/2013 1014   CALCIUM 9.8 04/10/2012 1018   ALKPHOS 56 03/19/2013 1014   ALKPHOS 54 04/10/2012 1018   AST 27 03/19/2013 1014   AST 23 04/10/2012 1018   ALT 28 03/19/2013 1014   ALT 24 04/10/2012 1018   BILITOT 0.80 03/19/2013 1014   BILITOT 0.4 04/10/2012 1018       RADIOGRAPHIC STUDIES:  No results found.  ASSESSMENT: 47 year old female with  #1 stage III (T4 N2) invasive lobular carcinoma of the left breast status post lumpectomy with axillary lymph node dissection. This was then followed by 4 cycles of dose dense a.c. completed on 07/16/2011. She is also status post 12 weeks of single agent Taxol completed 10/18/2011. She completed her radiation therapy to the left breast on 12/13/2011. On January 7 she started tamoxifen 20 mg daily. She is tolerating it well so far.  #2 myalgias and arthralgias    PLAN:  #1 Ms. [name] is doing well.  She has no sign of recurrence.  She will continue her Tamoxifen.    #2 Ms. [name] will continue her PRN percocet.  She is also going to try glucosamine/chondroitin.    #3 I referred Ms. [name] to see Dr. Odis Campos and Dr. Tamela Campos.  We will see her back in 6 months time.    All questions were answered. The patient knows to call the clinic with any problems, questions or concerns. We can certainly see the patient much sooner if necessary.  I spent 25 minutes counseling the patient face to face. The total time spent in the appointment was 30 minutes.   Elizabeth Second, MD Medical/Oncology [facility] [phone] (beeper) [phone] (Office)  03/19/2013, 11:13 AM

## 2013-06-01 ENCOUNTER — Encounter (INDEPENDENT_AMBULATORY_CARE_PROVIDER_SITE_OTHER): Payer: Self-pay | Admitting: *Deleted

## 2013-06-30 ENCOUNTER — Ambulatory Visit (INDEPENDENT_AMBULATORY_CARE_PROVIDER_SITE_OTHER): Payer: BC Managed Care – PPO | Admitting: Internal Medicine

## 2013-06-30 ENCOUNTER — Other Ambulatory Visit (INDEPENDENT_AMBULATORY_CARE_PROVIDER_SITE_OTHER): Payer: Self-pay | Admitting: *Deleted

## 2013-06-30 ENCOUNTER — Telehealth (INDEPENDENT_AMBULATORY_CARE_PROVIDER_SITE_OTHER): Payer: Self-pay | Admitting: *Deleted

## 2013-06-30 ENCOUNTER — Encounter (INDEPENDENT_AMBULATORY_CARE_PROVIDER_SITE_OTHER): Payer: Self-pay | Admitting: Internal Medicine

## 2013-06-30 VITALS — BP 132/84 | HR 65 | Temp 99.0°F | Ht 64.0 in | Wt 141.9 lb

## 2013-06-30 DIAGNOSIS — K5732 Diverticulitis of large intestine without perforation or abscess without bleeding: Secondary | ICD-10-CM

## 2013-06-30 DIAGNOSIS — K5792 Diverticulitis of intestine, part unspecified, without perforation or abscess without bleeding: Secondary | ICD-10-CM

## 2013-06-30 DIAGNOSIS — Z1211 Encounter for screening for malignant neoplasm of colon: Secondary | ICD-10-CM

## 2013-06-30 MED ORDER — PEG-KCL-NACL-NASULF-NA ASC-C 100 G PO SOLR: 1.0000 | Freq: Once | ORAL | Status: DC

## 2013-06-30 NOTE — Telephone Encounter (Signed)
Patient needs movi prep 

## 2013-06-30 NOTE — Patient Instructions (Addendum)
Colonoscopy with Dr. Rehman. The risks and benefits such as perforation, bleeding, and infection were reviewed with the patient and is agreeable. 

## 2013-06-30 NOTE — Progress Notes (Signed)
Subjective:     Patient ID: [name], female   DOB: 1966-10-18, 47 y.o.   MRN: [id number]  HPIReferred to our office by Dr. Margo Aye for hx of diverticulitis/hx of breast cancer. Hx of  stage III (T4 N2) invasive lobular carcinoma of the left breast. Patient is status post lumpectomy with axillary lymph node dissection in 2012. She did undergo chemo and radiation. Her last episode of diverticulitis was 2010 though the CT at that time did not reveal it .  Appetite is good. No weight loss. No dysphagia.          No abdominal pain. She usually has a BM daily. No melena or bright red rectal bleeding.   04/13/2009 CT abdomen/pelvis with CM:  IMPRESSION:  1. Interval resolved sigmoid colon inflammatory changes. Normal  appearance of the distal colon now seen.  2. No acute findings in the pelvis. Suspect physiologic  prominence of the perimetrium if the patient is premenopausal.   03/2008 CT abdomen/pelvis with CM: 1. Inflammatory changes around the descending/sigmoid junction.  Primary consideration would be diverticulitis although carcinoma  can have a similar presentation; follow up recommended.   Review of Systems see hpi Current Outpatient Prescriptions  Medication Sig Dispense Refill  . ALPRAZolam (XANAX) 1 MG tablet Take 1 tablet (1 mg total) by mouth 3 (three) times daily as needed for sleep.  90 tablet  4  . atenolol (TENORMIN) 25 MG tablet Take 25 mg by mouth daily.        . hydrocodone-acetaminophen (LORCET-HD) 5-500 MG per capsule Take 1 capsule by mouth every 6 (six) hours as needed. For pain  30 capsule  0  . HYDROcodone-homatropine (HYCODAN) 5-1.5 MG/5ML syrup       . ibuprofen (ADVIL,MOTRIN) 200 MG tablet Take 400 mg by mouth as needed. For fever       . oxyCODONE-acetaminophen (PERCOCET/ROXICET) 5-325 MG per tablet Take 1 tablet by mouth every 4 (four) hours as needed for pain.  90 tablet  0  . tamoxifen (NOLVADEX) 20 MG tablet TAKE 1 TABLET BY MOUTH EVERY DAY  90  tablet  3  . triamterene-hydrochlorothiazide (DYAZIDE) 37.5-25 MG per capsule Take 1 capsule by mouth every morning.        Marland Kitchen UNABLE TO FIND Breast Prothesis  1 each  0   No current facility-administered medications for this visit.        Objective:   Physical Exam  Filed Vitals:   06/30/13 1106  BP: 132/84  Pulse: 65  Temp: 99 F (37.2 C)  Height: 5\' 4"  (1.626 m)  Weight: 141 lb 14.4 oz (64.365 kg)   Alert and oriented. Skin warm and dry. Oral mucosa is moist.   . Sclera anicteric, conjunctivae is pink. Thyroid not enlarged. No cervical lymphadenopathy. Lungs clear. Heart regular rate and rhythm.  Abdomen is soft. Bowel sounds are positive. No hepatomegaly. No abdominal masses felt. No tenderness.  No edema to lower extremities.       Assessment:   Hx of breast cancer. Hx of diverticulitis. Needs surveillance colonoscopy to be sure there is not an undergoing colon cancer.    Plan:   Colonoscopy with Dr. Karilyn Cota.

## 2013-07-01 DIAGNOSIS — K5732 Diverticulitis of large intestine without perforation or abscess without bleeding: Secondary | ICD-10-CM | POA: Insufficient documentation

## 2013-07-14 ENCOUNTER — Encounter (HOSPITAL_COMMUNITY): Payer: Self-pay

## 2013-07-24 ENCOUNTER — Emergency Department (HOSPITAL_COMMUNITY): Payer: BC Managed Care – PPO

## 2013-07-24 ENCOUNTER — Encounter (HOSPITAL_COMMUNITY): Payer: Self-pay

## 2013-07-24 ENCOUNTER — Emergency Department (HOSPITAL_COMMUNITY)
Admission: EM | Admit: 2013-07-24 | Discharge: 2013-07-24 | Disposition: A | Discharge status: Home / Self Care | Payer: BC Managed Care – PPO | Attending: Emergency Medicine | Admitting: Emergency Medicine

## 2013-07-24 DIAGNOSIS — Y9289 Other specified places as the place of occurrence of the external cause: Secondary | ICD-10-CM | POA: Insufficient documentation

## 2013-07-24 DIAGNOSIS — Z853 Personal history of malignant neoplasm of breast: Secondary | ICD-10-CM | POA: Insufficient documentation

## 2013-07-24 DIAGNOSIS — W010XXA Fall on same level from slipping, tripping and stumbling without subsequent striking against object, initial encounter: Secondary | ICD-10-CM | POA: Insufficient documentation

## 2013-07-24 DIAGNOSIS — I10 Essential (primary) hypertension: Secondary | ICD-10-CM | POA: Insufficient documentation

## 2013-07-24 DIAGNOSIS — S82001A Unspecified fracture of right patella, initial encounter for closed fracture: Secondary | ICD-10-CM

## 2013-07-24 DIAGNOSIS — Z91040 Latex allergy status: Secondary | ICD-10-CM | POA: Insufficient documentation

## 2013-07-24 DIAGNOSIS — Z87891 Personal history of nicotine dependence: Secondary | ICD-10-CM | POA: Insufficient documentation

## 2013-07-24 DIAGNOSIS — Y939 Activity, unspecified: Secondary | ICD-10-CM | POA: Insufficient documentation

## 2013-07-24 DIAGNOSIS — Z85828 Personal history of other malignant neoplasm of skin: Secondary | ICD-10-CM | POA: Insufficient documentation

## 2013-07-24 DIAGNOSIS — Z79899 Other long term (current) drug therapy: Secondary | ICD-10-CM | POA: Insufficient documentation

## 2013-07-24 DIAGNOSIS — S82009A Unspecified fracture of unspecified patella, initial encounter for closed fracture: Secondary | ICD-10-CM | POA: Insufficient documentation

## 2013-07-24 MED ORDER — HYDROCODONE-ACETAMINOPHEN 5-325 MG PO TABS
1.0000 | ORAL_TABLET | Freq: Once | ORAL | Status: AC
  Administered 2013-07-24: 1 via ORAL
  Filled 2013-07-24: qty 1

## 2013-07-24 MED ORDER — HYDROCODONE-ACETAMINOPHEN 5-325 MG PO TABS: 1.0000 | ORAL_TABLET | Freq: Four times a day (QID) | ORAL | Status: DC | PRN

## 2013-07-24 NOTE — ED Notes (Signed)
Was out to eat dinner and went to the rest room, slipped and fell in front of the kitchen and injured right knee. Felt my shoes slide and was instantly on the floor. Instant pain to right knee and I could not get off the floor on my own. Swelling started instantly.

## 2013-07-24 NOTE — ED Provider Notes (Addendum)
CSN: [id number]     Arrival date & time 07/24/13  1949 History     First MD Initiated Contact with Patient 07/24/13 1958     Chief Complaint  Patient presents with  . Knee Injury   (Consider location/radiation/quality/duration/timing/severity/associated sxs/prior Treatment) The history is provided by the patient and the spouse.   47 year old female primary care Dr. Dannette Barbara. Patient was out at a restaurant went to the restroom slipped and fell in front of the kitchen and injured her right knee. No other complaints. Her shoe slipped and she went down to the floor. No loss of consciousness. The knee started swelling instantly. No prior history of injury to the knee. Pain to the knee is currently 2/10 without movement with movement it goes up to 10 out of 10. Pain is described as sharp. Made worse by movement. Improved by not moving.  Past Medical History  Diagnosis Date  . Hypertension   . Cancer     basal cell - skin  . Breast cancer, stage 4 dx'd 03/12/11    left  . Breast cancer, stage 3 10/18/2011  . Radiation adverse effect 12/02/2011   Past Surgical History  Procedure Laterality Date  . Lipoma excision  5 years    upper back  . Mastectomy    . Mastectomy partial / lumpectomy    . Lymph node dissection    . Axillary lymph node dissection    . Dilation and curettage of uterus    . Portacath placement    . Portacath removal     Family History  Problem Relation Age of Onset  . Breast cancer Maternal Grandmother   . Lung cancer Paternal Grandfather    History  Substance Use Topics  . Smoking status: Former Games developer  . Smokeless tobacco: Never Used     Comment: quit 5 yrs ago  . Alcohol Use: 0.0 oz/week    1-3 drink(s) per week     Comment: occasionally   OB History   Grav Para Term Preterm Abortions TAB SAB Ect Mult Living                 Review of Systems  Constitutional: Negative for fever.  HENT: Positive for neck pain.   Eyes: Negative for visual disturbance.   Respiratory: Negative for shortness of breath.   Cardiovascular: Negative for chest pain.  Gastrointestinal: Negative for abdominal pain.  Genitourinary: Negative for dysuria.  Musculoskeletal: Negative for back pain.  Skin: Positive for wound.  Neurological: Negative for headaches.  Hematological: Does not bruise/bleed easily.  Psychiatric/Behavioral: Negative for confusion.    Allergies  Bactrim and Latex  Home Medications   Current Outpatient Rx  Name  Route  Sig  Dispense  Refill  . ALPRAZolam (XANAX) 1 MG tablet   Oral   Take 1 mg by mouth 3 (three) times daily as needed for sleep or anxiety.         Marland Kitchen atenolol (TENORMIN) 50 MG tablet   Oral   Take 50 mg by mouth daily.         . Cholecalciferol (VITAMIN D PO)   Oral   Take 2 capsules by mouth daily.         Marland Kitchen docusate sodium (COLACE) 100 MG capsule   Oral   Take 100 mg by mouth daily.         Marland Kitchen ibuprofen (ADVIL,MOTRIN) 200 MG tablet   Oral   Take 400 mg by mouth daily as needed for  pain.         . Oxycodone HCl 10 MG TABS   Oral   Take 5 mg by mouth 2 (two) times daily as needed (pain).         . Psyllium (METAMUCIL PO)   Oral   Take 1 capsule by mouth daily.         . psyllium (REGULOID) 0.52 G capsule   Oral   Take 0.52 g by mouth daily.         . tamoxifen (NOLVADEX) 20 MG tablet      TAKE 1 TABLET BY MOUTH EVERY DAY   90 tablet   3   . triamterene-hydrochlorothiazide (DYAZIDE) 37.5-25 MG per capsule   Oral   Take 1 capsule by mouth every morning.           Marland Kitchen HYDROcodone-acetaminophen (NORCO/VICODIN) 5-325 MG per tablet   Oral   Take 1-2 tablets by mouth every 6 (six) hours as needed for pain.   20 tablet   0   . peg 3350 powder (MOVIPREP) 100 G SOLR   Oral   Take 1 kit (100 g total) by mouth once.   1 kit   0    BP 160/83  Pulse 98  Temp(Src) 98.1 F (36.7 C) (Oral)  Resp 20  Ht 5\' 5"  (1.651 m)  Wt 140 lb (63.504 kg)  BMI 23.3 kg/m2  SpO2 97% Physical Exam   Nursing note and vitals reviewed. Constitutional: She is oriented to person, place, and time. She appears well-developed and well-nourished. No distress.  HENT:  Head: Normocephalic and atraumatic.  Eyes: Conjunctivae and EOM are normal. Pupils are equal, round, and reactive to light.  Neck: Normal range of motion.  Cardiovascular: Normal rate, regular rhythm and normal heart sounds.   Pulmonary/Chest: Effort normal and breath sounds normal. No respiratory distress.  Abdominal: Soft. Bowel sounds are normal. There is no tenderness.  Musculoskeletal: She exhibits edema and tenderness.  Normal except for right knee with lateral and medial inferior swelling. No evidence of joint effusion. Patella appears to be non-dislocated. Distally dorsalis pedis pulses 2+ sensation is intact good movement and range of motion of the toes. Significant pain with any kind of movement of the right knee. Left knee and other extremities without any evidence of injury.  Clinically patient unable to straight leg raise her right leg clinically concerning for patellar tendon disruption.  Neurological: She is alert and oriented to person, place, and time. No cranial nerve deficit. She exhibits normal muscle tone. Coordination normal.  Skin: Skin is warm. No rash noted.    ED Course   Procedures (including critical care time)  Labs Reviewed - No data to display No results found. 1. Patellar fracture, right, closed, initial encounter     MDM  X-ray with obvious patellar fracture. Patient wants to followup with Dr. Romeo Apple who is not on-call tonight. Would place patient in the knee immobilizer crutches and have him call for appointment on Monday. Pain medication provided.   As stated in physical exam clinically concerning for patellar tendon disruption not able to straight leg raise her right leg. Also seems to have a deformity. Will discuss with Dr. Hilda Lias on call for orthopedics. Still feel that patient can  probably be treated with knee immobilizer crutches no weightbearing and followup with orthopedics.  Discussed with Hilda Lias he concurs that knee immobilizer crutches and nonweightbearing will be fine and then followup with orthopedics on Monday.    Bing Neighbors.  Deretha Emory, MD 07/24/13 6295  Shelda Jakes, MD 07/24/13 2116  Shelda Jakes, MD 07/24/13 2137

## 2013-07-24 NOTE — ED Notes (Addendum)
Patient reported she had crutches at home. Stated did not want to receive pair from ED as well due to cost. Instructed patient she was not to bear weight on right leg/knee. Patient verbalized understanding and stated she would not bear weight. Stated she would wait to ambulate until she retrieved crutches from husband at home.

## 2013-07-28 ENCOUNTER — Encounter (HOSPITAL_COMMUNITY): Admission: RE | Payer: Self-pay | Source: Ambulatory Visit

## 2013-07-28 ENCOUNTER — Ambulatory Visit (HOSPITAL_COMMUNITY)
Admission: RE | Admit: 2013-07-28 | Payer: BC Managed Care – PPO | Source: Ambulatory Visit | Admitting: Internal Medicine

## 2013-07-28 SURGERY — COLONOSCOPY
Anesthesia: Moderate Sedation

## 2013-08-02 ENCOUNTER — Encounter (INDEPENDENT_AMBULATORY_CARE_PROVIDER_SITE_OTHER): Payer: Self-pay

## 2013-08-20 ENCOUNTER — Encounter: Payer: Self-pay | Admitting: Internal Medicine

## 2013-09-20 ENCOUNTER — Other Ambulatory Visit (HOSPITAL_BASED_OUTPATIENT_CLINIC_OR_DEPARTMENT_OTHER): Payer: BC Managed Care – PPO | Admitting: Lab

## 2013-09-20 ENCOUNTER — Encounter: Payer: Self-pay | Admitting: Oncology

## 2013-09-20 ENCOUNTER — Telehealth: Payer: Self-pay | Admitting: *Deleted

## 2013-09-20 ENCOUNTER — Ambulatory Visit (HOSPITAL_BASED_OUTPATIENT_CLINIC_OR_DEPARTMENT_OTHER): Payer: BC Managed Care – PPO | Admitting: Oncology

## 2013-09-20 VITALS — BP 132/89 | HR 80 | Temp 98.2°F | Resp 20 | Ht 65.0 in | Wt 140.7 lb

## 2013-09-20 DIAGNOSIS — C50912 Malignant neoplasm of unspecified site of left female breast: Secondary | ICD-10-CM

## 2013-09-20 DIAGNOSIS — IMO0001 Reserved for inherently not codable concepts without codable children: Secondary | ICD-10-CM

## 2013-09-20 DIAGNOSIS — C50019 Malignant neoplasm of nipple and areola, unspecified female breast: Secondary | ICD-10-CM

## 2013-09-20 DIAGNOSIS — M2550 Pain in unspecified joint: Secondary | ICD-10-CM

## 2013-09-20 DIAGNOSIS — C50919 Malignant neoplasm of unspecified site of unspecified female breast: Secondary | ICD-10-CM

## 2013-09-20 LAB — CBC WITH DIFFERENTIAL/PLATELET
EOS%: 3.2 % (ref 0.0–7.0)
LYMPH%: 38.1 % (ref 14.0–49.7)
MCH: 30.9 pg (ref 25.1–34.0)
MCV: 92.5 fL (ref 79.5–101.0)
MONO%: 6.2 % (ref 0.0–14.0)
Platelets: 211 10*3/uL (ref 145–400)
RBC: 4.37 10*6/uL (ref 3.70–5.45)
RDW: 13 % (ref 11.2–14.5)

## 2013-09-20 LAB — COMPREHENSIVE METABOLIC PANEL (CC13)
AST: 23 U/L (ref 5–34)
Albumin: 3.9 g/dL (ref 3.5–5.0)
Alkaline Phosphatase: 44 U/L (ref 40–150)
BUN: 15.9 mg/dL (ref 7.0–26.0)
Glucose: 91 mg/dl (ref 70–140)
Potassium: 3.6 mEq/L (ref 3.5–5.1)
Sodium: 144 mEq/L (ref 136–145)
Total Bilirubin: 0.35 mg/dL (ref 0.20–1.20)

## 2013-09-20 NOTE — Telephone Encounter (Signed)
appts made and printed...td 

## 2013-09-20 NOTE — Progress Notes (Signed)
OFFICE PROGRESS NOTE  CC Dr. Olevia Perches, [facility], MD  [address] [place] [address] Dr. Lurline Hare  DIAGNOSIS: 47 year old female with stage III (T4 N2) invasive lobular carcinoma of the left breast. Patient is status post lumpectomy with axillary lymph node dissection or  PRIOR THERAPY:  #1 patient initially underwent a central lumpectomy with axillary lymph node dissection.  #2 she then underwent dose dense adjuvant chemotherapy initially consisting of 4 cycles of a.c. given from 05/25/2011 2 07/16/2011.  #3 she then received 12 weeks of single agent Taxol completed on 10/18/2011.  #4 she has now completed radiation therapy to the left breast on 12/13/2011.  #5 patient has begun tamoxifen 20 mg adjuvantly about 10 days ago.  CURRENT THERAPY: Tamoxifen 20 mg daily 12/23/2011.  INTERVAL HISTORY: [name] 47 y.o. female returns for followup visit today.  She is doing well.  She is continuing to take her tamoxifen.  Unfortunately patient had a accident where she shattered the right patella. She is now in the past and in a wheelchair today. She states that this is the worst experience that she has. It is more worse than chemotherapy and the breast cancer that she has gone through. She continues to otherwise do well. She has no nausea vomiting no fevers chills night sweats. Remainder of the 10 point review of systems is negative. MEDICAL HISTORY: Past Medical History  Diagnosis Date  . Hypertension   . Cancer     basal cell - skin  . Breast cancer, stage 4 dx'd 03/12/11    left  . Breast cancer, stage 3 10/18/2011  . Radiation adverse effect 12/02/2011    ALLERGIES:  is allergic to bactrim and latex.  MEDICATIONS:  Reviewed and recorded  SURGICAL HISTORY:  Past Surgical History  Procedure Laterality Date  . Lipoma excision  5 years    upper back  . Mastectomy    . Mastectomy partial / lumpectomy    . Lymph node dissection    . Axillary  lymph node dissection    . Dilation and curettage of uterus    . Portacath placement    . Portacath removal      REVIEW OF SYSTEMS:   General: fatigue (-), night sweats (-), fever (-), pain (-) Lymph: palpable nodes (-) HEENT: vision changes (-), mucositis (-), gum bleeding (-), epistaxis (-) Cardiovascular: chest pain (-), palpitations (-) Pulmonary: shortness of breath (-), dyspnea on exertion (-), cough (-), hemoptysis (-) GI:  Early satiety (-), melena (-), dysphagia (-), nausea/vomiting (-), diarrhea (-) GU: dysuria (-), hematuria (-), incontinence (-) Musculoskeletal: joint swelling (-), joint pain (-), back pain (-) Neuro: weakness (-), numbness (-), headache (-), confusion (-) Skin: Rash (-), lesions (-), dryness (-) Psych: depression (-), suicidal/homicidal ideation (-), feeling of hopelessness (-)  Health Maintenance  Mammogram: 06/2012 Colonoscopy: n/a  Bone Density Scan: n/a Pap Smear: 3 years ago Eye Exam: 4 years ago Vitamin D Level: pending today's draw Lipid Panel: 3-4 years.   PHYSICAL EXAMINATION: BP 132/89  Pulse 80  Temp(Src) 98.2 F (36.8 C) (Oral)  Resp 20  Ht 5\' 5"  (1.651 m)  Wt 140 lb 11.2 oz (63.821 kg)  BMI 23.41 kg/m2 General: Patient is a well appearing female in no acute distress HEENT: PERRLA, sclerae anicteric no conjunctival pallor, MMM Neck: supple, no palpable adenopathy Lungs: clear to auscultation bilaterally, no wheezes, rhonchi, or rales Cardiovascular: regular rate rhythm, S1, S2, no murmurs, rubs or gallops Abdomen:  Soft, non-tender, non-distended, normoactive bowel sounds, no HSM Extremities: warm and well perfused, no clubbing, cyanosis, or edema Skin: No rashes or lesions Neuro: Non-focal Left breast: No masses, well-healed surgical scar nipple is missing. Right breast no masses or nipple discharge. ECOG PERFORMANCE STATUS: 1 - Symptomatic but completely ambulatory    LABORATORY DATA: Lab Results  Component Value Date    WBC 4.5 09/20/2013   HGB 13.5 09/20/2013   HCT 40.4 09/20/2013   MCV 92.5 09/20/2013   PLT 211 09/20/2013      Chemistry      Component Value Date/Time   NA 144 09/20/2013 1015   NA 140 09/24/2012 1039   K 3.6 09/20/2013 1015   K 4.1 09/24/2012 1039   CL 99 03/19/2013 1014   CL 102 09/24/2012 1039   CO2 28 09/20/2013 1015   CO2 31 04/10/2012 1018   BUN 15.9 09/20/2013 1015   BUN 21 09/24/2012 1039   CREATININE 0.8 09/20/2013 1015   CREATININE 1.10 09/24/2012 1039      Component Value Date/Time   CALCIUM 9.4 09/20/2013 1015   CALCIUM 9.8 04/10/2012 1018   ALKPHOS 44 09/20/2013 1015   ALKPHOS 54 04/10/2012 1018   AST 23 09/20/2013 1015   AST 23 04/10/2012 1018   ALT 20 09/20/2013 1015   ALT 24 04/10/2012 1018   BILITOT 0.35 09/20/2013 1015   BILITOT 0.4 04/10/2012 1018       RADIOGRAPHIC STUDIES:  No results found.  ASSESSMENT: 47 year old female with  #1 stage III (T4 N2) invasive lobular carcinoma of the left breast status post lumpectomy with axillary lymph node dissection. This was then followed by 4 cycles of dose dense a.c. completed on 07/16/2011. She is also status post 12 weeks of single agent Taxol completed 10/18/2011. She completed her radiation therapy to the left breast on 12/13/2011. On January 7, 2013she started tamoxifen 20 mg daily. She is tolerating it well so far.  #2 myalgias and arthralgias  #3 right patellar fracture    PLAN:  #1 continue tamoxifen 20 mg daily.  #2 patient will be seen back in 6 months time for followup.  All questions were answered. The patient knows to call the clinic with any problems, questions or concerns. We can certainly see the patient much sooner if necessary.  I spent 15 minutes counseling the patient face to face. The total time spent in the appointment was 20 minutes.   Drue Second, MD Medical/Oncology [facility] [phone] (beeper) [phone] (Office)  09/20/2013, 11:39 AM

## 2013-09-20 NOTE — Patient Instructions (Addendum)
Continue tamoxifen 20 mg daily.  I will see you back in 6 months time.

## 2013-11-29 ENCOUNTER — Other Ambulatory Visit: Payer: Self-pay | Admitting: Oncology

## 2013-11-29 ENCOUNTER — Other Ambulatory Visit: Payer: Self-pay | Admitting: Internal Medicine

## 2013-11-29 DIAGNOSIS — Z853 Personal history of malignant neoplasm of breast: Secondary | ICD-10-CM

## 2013-11-29 DIAGNOSIS — Z9889 Other specified postprocedural states: Secondary | ICD-10-CM

## 2013-12-15 ENCOUNTER — Ambulatory Visit
Admission: RE | Admit: 2013-12-15 | Discharge: 2013-12-15 | Disposition: A | Discharge status: Home / Self Care | Payer: BC Managed Care – PPO | Source: Ambulatory Visit | Attending: Oncology | Admitting: Oncology

## 2013-12-15 DIAGNOSIS — Z853 Personal history of malignant neoplasm of breast: Secondary | ICD-10-CM

## 2013-12-15 DIAGNOSIS — Z9889 Other specified postprocedural states: Secondary | ICD-10-CM

## 2013-12-16 HISTORY — PX: PATELLA RECONSTRUCTION: SHX736

## 2014-02-11 ENCOUNTER — Telehealth: Payer: Self-pay

## 2014-02-11 ENCOUNTER — Telehealth: Payer: Self-pay | Admitting: Oncology

## 2014-02-11 NOTE — Telephone Encounter (Signed)
Pt called - wants to be seen.  Has a swollen lymph node she is worried about.  Wants to be seen on 02/16/14 d/t transportation.  Khan's schedule full.  Per Lisabeth Register, ok to add to her schedule for that day.

## 2014-02-11 NOTE — Telephone Encounter (Signed)
, °

## 2014-02-16 ENCOUNTER — Telehealth: Payer: Self-pay | Admitting: Adult Health

## 2014-02-16 ENCOUNTER — Ambulatory Visit: Payer: BC Managed Care – PPO | Admitting: Adult Health

## 2014-02-16 ENCOUNTER — Telehealth: Payer: Self-pay | Admitting: Oncology

## 2014-02-16 NOTE — Telephone Encounter (Signed)
, °

## 2014-03-28 ENCOUNTER — Ambulatory Visit: Payer: BC Managed Care – PPO | Admitting: Oncology

## 2014-03-28 ENCOUNTER — Other Ambulatory Visit: Payer: BC Managed Care – PPO

## 2014-03-31 ENCOUNTER — Telehealth: Payer: Self-pay | Admitting: Oncology

## 2014-03-31 NOTE — Telephone Encounter (Signed)
, °

## 2014-04-02 ENCOUNTER — Other Ambulatory Visit: Payer: Self-pay | Admitting: Oncology

## 2014-04-06 ENCOUNTER — Other Ambulatory Visit: Payer: Self-pay | Admitting: Oncology

## 2014-05-05 ENCOUNTER — Ambulatory Visit: Payer: BC Managed Care – PPO | Attending: Orthopedic Surgery | Admitting: Physical Therapy

## 2014-05-05 DIAGNOSIS — R5381 Other malaise: Secondary | ICD-10-CM | POA: Diagnosis not present

## 2014-05-05 DIAGNOSIS — M25669 Stiffness of unspecified knee, not elsewhere classified: Secondary | ICD-10-CM | POA: Diagnosis not present

## 2014-05-05 DIAGNOSIS — I10 Essential (primary) hypertension: Secondary | ICD-10-CM | POA: Insufficient documentation

## 2014-05-05 DIAGNOSIS — IMO0001 Reserved for inherently not codable concepts without codable children: Secondary | ICD-10-CM | POA: Insufficient documentation

## 2014-05-05 DIAGNOSIS — R269 Unspecified abnormalities of gait and mobility: Secondary | ICD-10-CM | POA: Diagnosis not present

## 2014-05-16 ENCOUNTER — Ambulatory Visit: Payer: BC Managed Care – PPO | Attending: Orthopedic Surgery | Admitting: Physical Therapy

## 2014-05-16 DIAGNOSIS — R269 Unspecified abnormalities of gait and mobility: Secondary | ICD-10-CM | POA: Diagnosis not present

## 2014-05-16 DIAGNOSIS — R5381 Other malaise: Secondary | ICD-10-CM | POA: Insufficient documentation

## 2014-05-16 DIAGNOSIS — IMO0001 Reserved for inherently not codable concepts without codable children: Secondary | ICD-10-CM | POA: Diagnosis present

## 2014-05-16 DIAGNOSIS — I10 Essential (primary) hypertension: Secondary | ICD-10-CM | POA: Insufficient documentation

## 2014-05-16 DIAGNOSIS — M25669 Stiffness of unspecified knee, not elsewhere classified: Secondary | ICD-10-CM | POA: Insufficient documentation

## 2014-05-19 ENCOUNTER — Ambulatory Visit: Payer: BC Managed Care – PPO | Admitting: *Deleted

## 2014-05-19 DIAGNOSIS — IMO0001 Reserved for inherently not codable concepts without codable children: Secondary | ICD-10-CM | POA: Diagnosis not present

## 2014-05-23 ENCOUNTER — Ambulatory Visit: Payer: BC Managed Care – PPO | Admitting: *Deleted

## 2014-05-23 DIAGNOSIS — IMO0001 Reserved for inherently not codable concepts without codable children: Secondary | ICD-10-CM | POA: Diagnosis not present

## 2014-05-25 ENCOUNTER — Ambulatory Visit: Payer: BC Managed Care – PPO | Admitting: Physical Therapy

## 2014-05-25 DIAGNOSIS — IMO0001 Reserved for inherently not codable concepts without codable children: Secondary | ICD-10-CM | POA: Diagnosis not present

## 2014-06-09 ENCOUNTER — Telehealth: Payer: Self-pay | Admitting: Hematology and Oncology

## 2014-06-09 NOTE — Telephone Encounter (Signed)
, °

## 2014-06-22 ENCOUNTER — Ambulatory Visit: Payer: BC Managed Care – PPO | Admitting: Oncology

## 2014-06-22 ENCOUNTER — Other Ambulatory Visit: Payer: BC Managed Care – PPO

## 2014-09-05 ENCOUNTER — Other Ambulatory Visit: Payer: Self-pay

## 2014-09-05 DIAGNOSIS — C50919 Malignant neoplasm of unspecified site of unspecified female breast: Secondary | ICD-10-CM

## 2014-09-06 ENCOUNTER — Other Ambulatory Visit (HOSPITAL_BASED_OUTPATIENT_CLINIC_OR_DEPARTMENT_OTHER): Payer: BC Managed Care – PPO

## 2014-09-06 ENCOUNTER — Telehealth: Payer: Self-pay | Admitting: Hematology and Oncology

## 2014-09-06 ENCOUNTER — Ambulatory Visit (HOSPITAL_BASED_OUTPATIENT_CLINIC_OR_DEPARTMENT_OTHER): Payer: BC Managed Care – PPO | Admitting: Hematology and Oncology

## 2014-09-06 ENCOUNTER — Encounter: Payer: Self-pay | Admitting: Hematology and Oncology

## 2014-09-06 VITALS — BP 117/76 | HR 76 | Temp 97.9°F | Resp 18 | Ht 65.0 in | Wt 147.1 lb

## 2014-09-06 DIAGNOSIS — C50919 Malignant neoplasm of unspecified site of unspecified female breast: Secondary | ICD-10-CM

## 2014-09-06 DIAGNOSIS — C50019 Malignant neoplasm of nipple and areola, unspecified female breast: Secondary | ICD-10-CM

## 2014-09-06 DIAGNOSIS — C50912 Malignant neoplasm of unspecified site of left female breast: Secondary | ICD-10-CM

## 2014-09-06 LAB — COMPREHENSIVE METABOLIC PANEL (CC13)
ALT: 10 U/L (ref 0–55)
ANION GAP: 10 meq/L (ref 3–11)
AST: 15 U/L (ref 5–34)
Albumin: 4 g/dL (ref 3.5–5.0)
Alkaline Phosphatase: 55 U/L (ref 40–150)
BUN: 22.6 mg/dL (ref 7.0–26.0)
CALCIUM: 9.7 mg/dL (ref 8.4–10.4)
CHLORIDE: 102 meq/L (ref 98–109)
CO2: 29 meq/L (ref 22–29)
Creatinine: 1 mg/dL (ref 0.6–1.1)
Glucose: 112 mg/dl (ref 70–140)
POTASSIUM: 3.5 meq/L (ref 3.5–5.1)
SODIUM: 141 meq/L (ref 136–145)
TOTAL PROTEIN: 7.4 g/dL (ref 6.4–8.3)
Total Bilirubin: 0.35 mg/dL (ref 0.20–1.20)

## 2014-09-06 LAB — CBC WITH DIFFERENTIAL/PLATELET
BASO%: 1.1 % (ref 0.0–2.0)
Basophils Absolute: 0.1 10*3/uL (ref 0.0–0.1)
EOS%: 2.9 % (ref 0.0–7.0)
Eosinophils Absolute: 0.2 10*3/uL (ref 0.0–0.5)
HEMATOCRIT: 37 % (ref 34.8–46.6)
HGB: 12.1 g/dL (ref 11.6–15.9)
LYMPH#: 2.4 10*3/uL (ref 0.9–3.3)
LYMPH%: 31.3 % (ref 14.0–49.7)
MCH: 30.5 pg (ref 25.1–34.0)
MCHC: 32.6 g/dL (ref 31.5–36.0)
MCV: 93.5 fL (ref 79.5–101.0)
MONO#: 0.4 10*3/uL (ref 0.1–0.9)
MONO%: 5.7 % (ref 0.0–14.0)
NEUT#: 4.4 10*3/uL (ref 1.5–6.5)
NEUT%: 59 % (ref 38.4–76.8)
Platelets: 424 10*3/uL — ABNORMAL HIGH (ref 145–400)
RBC: 3.96 10*6/uL (ref 3.70–5.45)
RDW: 12.5 % (ref 11.2–14.5)
WBC: 7.5 10*3/uL (ref 3.9–10.3)

## 2014-09-06 NOTE — Progress Notes (Signed)
Patient Care Team: Zach Hall, MD as PCP - General (Internal Medicine)  DIAGNOSIS: No matching staging information was found for the patient.  SUMMARY OF ONCOLOGIC HISTORY:   Breast cancer, stage 3   04/09/2011 Surgery Left breast lumpectomy invasive lobular cancer grade 12.7 cm involving dermis and subcutaneous tissue the nipple, LVI and PNI present, LCIS, margins clear, 2 SLN +2 SLN isolated tumor cells, 4 AxLN negative ER 76% PR 93% Ki-67 18% HER-2 -ve ratio 1.15   05/16/2011 - 10/18/2011 Chemotherapy 4 cycles of dose dense a.c. completed on 07/16/2011. She is also status post 12 weeks of single agent Taxol   11/07/2011 - 12/13/2011 Radiation Therapy Radiation therapy to lumpectomy site   12/23/2011 -  Anti-estrogen oral therapy Tamoxifen 20 mg daily    CHIEF COMPLIANT: Six-month followup of breast cancer  INTERVAL HISTORY: [name] is a 48-year-old lady with above-mentioned history of left breast cancer treated with lumpectomy followed by adjuvant chemotherapy radiation and is currently on tamoxifen 20 mg daily. She is tolerating tamoxifen fairly well without any major problems or concerns. She recently fell and fractured her right patella. In fact she fractured it twice and required 2 surgeries. Last one was 2 weeks ago. She is still in crutches and required physical therapy. She denies any lumps or nodules in the breast. Because she had invasive lobular cancer, this was not picked up on the initial mammogram, and she is very concerned that there might be cancer in the breasts that would be picked up by another mammogram.  REVIEW OF SYSTEMS:   Constitutional: Denies fevers, chills or abnormal weight loss Eyes: Denies blurriness of vision Ears, nose, mouth, throat, and face: Denies mucositis or sore throat Respiratory: Denies cough, dyspnea or wheezes Cardiovascular: Denies palpitation, chest discomfort or lower extremity swelling Gastrointestinal:  Denies nausea, heartburn or change in  bowel habits Skin: Denies abnormal skin rashes Lymphatics: Denies new lymphadenopathy or easy bruising Neurological:Denies numbness, tingling or new weaknesses Behavioral/Psych: Mood is stable, no new changes  Breast:  denies any pain or lumps or nodules in either breasts All other systems were reviewed with the patient and are negative.  I have reviewed the past medical history, past surgical history, social history and family history with the patient and they are unchanged from previous note.  ALLERGIES:  is allergic to bactrim; dilaudid; and latex.  MEDICATIONS:  Current Outpatient Prescriptions  Medication Sig Dispense Refill  . ALPRAZolam (XANAX) 1 MG tablet Take 1 mg by mouth 3 (three) times daily as needed for sleep or anxiety.      . atenolol (TENORMIN) 50 MG tablet Take 50 mg by mouth daily.      . Cholecalciferol (VITAMIN D PO) Take 2 capsules by mouth daily.      . docusate sodium (COLACE) 100 MG capsule Take 100 mg by mouth daily.      . HYDROcodone-acetaminophen (NORCO/VICODIN) 5-325 MG per tablet Take 1-2 tablets by mouth every 6 (six) hours as needed for pain.  20 tablet  0  . ibuprofen (ADVIL,MOTRIN) 200 MG tablet Take 400 mg by mouth daily as needed for pain.      . Oxycodone HCl 10 MG TABS Take 5 mg by mouth 2 (two) times daily as needed (pain).      . tamoxifen (NOLVADEX) 20 MG tablet TAKE 1 TABLET BY MOUTH EVERY DAY  90 tablet  3  . triamterene-hydrochlorothiazide (DYAZIDE) 37.5-25 MG per capsule Take 1 capsule by mouth every morning.           No current facility-administered medications for this visit.    PHYSICAL EXAMINATION: ECOG PERFORMANCE STATUS: 1 - Symptomatic but completely ambulatory  Filed Vitals:   09/06/14 1145  BP: 117/76  Pulse: 76  Temp: 97.9 F (36.6 C)  Resp: 18   Filed Weights   09/06/14 1145  Weight: 147 lb 1.6 oz (66.724 kg)    GENERAL:alert, no distress and comfortable SKIN: skin color, texture, turgor are normal, no rashes or  significant lesions EYES: normal, Conjunctiva are pink and non-injected, sclera clear OROPHARYNX:no exudate, no erythema and lips, buccal mucosa, and tongue normal  NECK: supple, thyroid normal size, non-tender, without nodularity LYMPH:  no palpable lymphadenopathy in the cervical, axillary or inguinal LUNGS: clear to auscultation and percussion with normal breathing effort HEART: regular rate & rhythm and no murmurs and no lower extremity edema ABDOMEN:abdomen soft, non-tender and normal bowel sounds Musculoskeletal: Right leg is in brace because of patella fracture NEURO: alert & oriented x 3 with fluent speech, no focal motor/sensory deficits   LABORATORY DATA:  I have reviewed the data as listed   Chemistry      Component Value Date/Time   NA 141 09/06/2014 1105   NA 140 09/24/2012 1039   K 3.5 09/06/2014 1105   K 4.1 09/24/2012 1039   CL 99 03/19/2013 1014   CL 102 09/24/2012 1039   CO2 29 09/06/2014 1105   CO2 31 04/10/2012 1018   BUN 22.6 09/06/2014 1105   BUN 21 09/24/2012 1039   CREATININE 1.0 09/06/2014 1105   CREATININE 1.10 09/24/2012 1039      Component Value Date/Time   CALCIUM 9.7 09/06/2014 1105   CALCIUM 9.8 04/10/2012 1018   ALKPHOS 55 09/06/2014 1105   ALKPHOS 54 04/10/2012 1018   AST 15 09/06/2014 1105   AST 23 04/10/2012 1018   ALT 10 09/06/2014 1105   ALT 24 04/10/2012 1018   BILITOT 0.35 09/06/2014 1105   BILITOT 0.4 04/10/2012 1018       Lab Results  Component Value Date   WBC 7.5 09/06/2014   HGB 12.1 09/06/2014   HCT 37.0 09/06/2014   MCV 93.5 09/06/2014   PLT 424* 09/06/2014   NEUTROABS 4.4 09/06/2014     RADIOGRAPHIC STUDIES: I have personally reviewed the radiology reports and agreed with their findings. No results found.   ASSESSMENT & PLAN:  Breast cancer, stage 3 Left breast cancer T4, N1, M0 stage III invasive lobular cancer status post surgery, adjuvant chemotherapy and radiation followed by antiestrogen therapy with tamoxifen.  I reviewed  her entire history from diagnosis still current. She reports that because of recent episodes of patella fracture, this has limited her activities. She wonders if her bones are osteoporotic. When I reviewed the bone density test done in 2013 which did not show any evidence of osteoporosis she had normal bone sent. The fact that she's on tamoxifen is supposed to help her prevent osteoporosis as well. July reassured her that it is not related to tamoxifen that she has had these 2 fractures.  Surveillance: We will be obtaining an MRI and mammograms annually. This will be done at the end of December or January. I will see her after that to do a breast exam. I discussed with her that routine scans and not necessary for surveillance. However if she had a new symptom of concern then we can certainly evaluate that with the scan. Given that she is not very ambulatory, I encouraged her to stay active and to call   us if she develops any new onset of swelling and pain in the legs as these can be a sign of blood clot. Patient understands that her risk of blood clot is increased been on tamoxifen.      Orders Placed This Encounter  Procedures  . MM Digital Diagnostic Bilat    EPIC ORDER PF: 12/15/13  [facility]  PT WALKS WITH A CANE  KF/ANN  [facility]      Standing Status: Future     Number of Occurrences:      Standing Expiration Date: 09/06/2015    Order Specific Question:  Reason for Exam (SYMPTOM  OR DIAGNOSIS REQUIRED)    Answer:  Left breast cancer status post lumpectomy, annual followup    Order Specific Question:  Is the patient pregnant?    Answer:  No    Order Specific Question:  Preferred imaging location?    Answer:  [facility]  . MR Breast Bilateral W Contrast    Standing Status: Future     Number of Occurrences:      Standing Expiration Date: 09/06/2015    Order Specific Question:  Reason for Exam (SYMPTOM  OR DIAGNOSIS REQUIRED)    Answer:  Invasive lobular cancer on the left breast treated with  lumpectomy, original cancer not visible through mammograms    Order Specific Question:  Preferred imaging location?    Answer:  [facility]    Order Specific Question:  Does the patient have a pacemaker or implanted devices?    Answer:  No    Order Specific Question:  What is the patient's sedation requirement?    Answer:  No Sedation  . CBC with Differential    Standing Status: Future     Number of Occurrences:      Standing Expiration Date: 09/06/2015  . Comprehensive metabolic panel (Cmet) - [facility]    Standing Status: Future     Number of Occurrences:      Standing Expiration Date: 09/06/2015   The patient has a good understanding of the overall plan. she agrees with it. She will call with any problems that may develop before her next visit here.  I spent 25 minutes counseling the patient face to face. The total time spent in the appointment was 30 minutes and more than 50% was on counseling and review of test results    Rulon Eisenmenger, MD 09/06/2014 1:20 PM

## 2014-09-06 NOTE — Assessment & Plan Note (Signed)
Left breast cancer T4, N1, M0 stage III invasive lobular cancer status post surgery, adjuvant chemotherapy and radiation followed by antiestrogen therapy with tamoxifen.  I reviewed her entire history from diagnosis still current. She reports that because of recent episodes of patella fracture, this has limited her activities. She wonders if her bones are osteoporotic. When I reviewed the bone density test done in 2013 which did not show any evidence of osteoporosis she had normal bone sent. The fact that she's on tamoxifen is supposed to help her prevent osteoporosis as well. July reassured her that it is not related to tamoxifen that she has had these 2 fractures.  Surveillance: We will be obtaining an MRI and mammograms annually. This will be done at the end of December or January. I will see her after that to do a breast exam. I discussed with her that routine scans and not necessary for surveillance. However if she had a new symptom of concern then we can certainly evaluate that with the scan. Given that she is not very ambulatory, I encouraged her to stay active and to call us if she develops any new onset of swelling and pain in the legs as these can be a sign of blood clot. Patient understands that her risk of blood clot is increased been on tamoxifen.

## 2014-09-06 NOTE — Telephone Encounter (Signed)
, °

## 2014-10-28 ENCOUNTER — Other Ambulatory Visit: Payer: Self-pay | Admitting: Oncology

## 2014-10-30 ENCOUNTER — Other Ambulatory Visit: Payer: Self-pay

## 2014-10-30 DIAGNOSIS — C50919 Malignant neoplasm of unspecified site of unspecified female breast: Secondary | ICD-10-CM

## 2014-10-30 MED ORDER — ALPRAZOLAM 1 MG PO TABS: 1.0000 mg | ORAL_TABLET | Freq: Two times a day (BID) | ORAL | Status: AC | PRN

## 2014-12-15 ENCOUNTER — Ambulatory Visit
Admission: RE | Admit: 2014-12-15 | Discharge: 2014-12-15 | Disposition: A | Discharge status: Home / Self Care | Payer: BC Managed Care – PPO | Source: Ambulatory Visit | Attending: Hematology and Oncology | Admitting: Hematology and Oncology

## 2014-12-15 DIAGNOSIS — C50912 Malignant neoplasm of unspecified site of left female breast: Secondary | ICD-10-CM

## 2014-12-15 MED ORDER — GADOBENATE DIMEGLUMINE 529 MG/ML IV SOLN
12.0000 mL | Freq: Once | INTRAVENOUS | Status: AC | PRN
  Administered 2014-12-15: 12 mL via INTRAVENOUS

## 2014-12-21 ENCOUNTER — Ambulatory Visit (HOSPITAL_COMMUNITY): Payer: BC Managed Care – PPO

## 2014-12-27 ENCOUNTER — Other Ambulatory Visit: Payer: BC Managed Care – PPO

## 2014-12-27 ENCOUNTER — Ambulatory Visit: Payer: BC Managed Care – PPO | Admitting: Hematology and Oncology

## 2014-12-27 NOTE — Assessment & Plan Note (Signed)
Left breast cancer T4, N1, M0 stage III invasive lobular cancer status post surgery, adjuvant chemotherapy and radiation followed by antiestrogen therapy with tamoxifen that started January 2013   Tamoxifen toxicities:  Breast cancer surveillance: 1. Breast MRI and 3-D mammograms done 12/15/2014 were normal 2. Breast exam 12/27/2014 is normal  History of patellar fracture: Improving.  Survivorship:Discussed the importance of physical exercise in decreasing the likelihood of breast cancer recurrence. Recommended 30 mins daily 6 days a week of either brisk walking or cycling or swimming. Encouraged patient to eat more fruits and vegetables and decrease red meat.   Return to clinic in 6 months for follow-up

## 2015-01-11 ENCOUNTER — Other Ambulatory Visit: Payer: Self-pay

## 2015-01-12 ENCOUNTER — Telehealth: Payer: Self-pay | Admitting: Hematology and Oncology

## 2015-01-12 NOTE — Progress Notes (Signed)
Pt appt on 1/12 was for review of scans.  Pt called for results and per Dr. Lindi Adie scans were good.  Pt requested to cancel appt on 1/12 - ok per Dr Lindi Adie.  Pof entered for 12 month appt.

## 2015-01-12 NOTE — Telephone Encounter (Signed)
, °

## 2015-01-20 ENCOUNTER — Telehealth: Payer: Self-pay | Admitting: Hematology and Oncology

## 2015-01-20 NOTE — Telephone Encounter (Signed)
, °

## 2015-01-24 ENCOUNTER — Telehealth: Payer: Self-pay | Admitting: Hematology and Oncology

## 2015-01-24 ENCOUNTER — Ambulatory Visit: Payer: Self-pay | Admitting: Hematology and Oncology

## 2015-01-24 ENCOUNTER — Other Ambulatory Visit: Payer: Self-pay

## 2015-01-24 NOTE — Telephone Encounter (Signed)
, °

## 2015-01-26 ENCOUNTER — Ambulatory Visit: Payer: Self-pay | Admitting: Hematology and Oncology

## 2015-01-26 ENCOUNTER — Other Ambulatory Visit: Payer: Self-pay

## 2015-01-26 ENCOUNTER — Telehealth: Payer: Self-pay | Admitting: Hematology and Oncology

## 2015-01-26 ENCOUNTER — Other Ambulatory Visit (HOSPITAL_BASED_OUTPATIENT_CLINIC_OR_DEPARTMENT_OTHER): Payer: BLUE CROSS/BLUE SHIELD

## 2015-01-26 ENCOUNTER — Ambulatory Visit (HOSPITAL_BASED_OUTPATIENT_CLINIC_OR_DEPARTMENT_OTHER): Payer: BLUE CROSS/BLUE SHIELD | Admitting: Hematology and Oncology

## 2015-01-26 VITALS — BP 131/85 | HR 73 | Temp 97.6°F | Resp 18 | Ht 65.0 in | Wt 153.9 lb

## 2015-01-26 DIAGNOSIS — C50912 Malignant neoplasm of unspecified site of left female breast: Secondary | ICD-10-CM

## 2015-01-26 LAB — CBC WITH DIFFERENTIAL/PLATELET
BASO%: 1 % (ref 0.0–2.0)
BASOS ABS: 0.1 10*3/uL (ref 0.0–0.1)
EOS%: 2.7 % (ref 0.0–7.0)
Eosinophils Absolute: 0.2 10*3/uL (ref 0.0–0.5)
HEMATOCRIT: 40.7 % (ref 34.8–46.6)
HEMOGLOBIN: 13.3 g/dL (ref 11.6–15.9)
LYMPH#: 1.9 10*3/uL (ref 0.9–3.3)
LYMPH%: 27.5 % (ref 14.0–49.7)
MCH: 30.9 pg (ref 25.1–34.0)
MCHC: 32.7 g/dL (ref 31.5–36.0)
MCV: 94.4 fL (ref 79.5–101.0)
MONO#: 0.5 10*3/uL (ref 0.1–0.9)
MONO%: 7.5 % (ref 0.0–14.0)
NEUT#: 4.2 10*3/uL (ref 1.5–6.5)
NEUT%: 61.3 % (ref 38.4–76.8)
PLATELETS: 241 10*3/uL (ref 145–400)
RBC: 4.31 10*6/uL (ref 3.70–5.45)
RDW: 13.4 % (ref 11.2–14.5)
WBC: 6.9 10*3/uL (ref 3.9–10.3)

## 2015-01-26 LAB — COMPREHENSIVE METABOLIC PANEL (CC13)
ALBUMIN: 4.1 g/dL (ref 3.5–5.0)
ALK PHOS: 53 U/L (ref 40–150)
ALT: 20 U/L (ref 0–55)
ANION GAP: 13 meq/L — AB (ref 3–11)
AST: 20 U/L (ref 5–34)
BUN: 13.8 mg/dL (ref 7.0–26.0)
CALCIUM: 9.4 mg/dL (ref 8.4–10.4)
CO2: 30 mEq/L — ABNORMAL HIGH (ref 22–29)
Chloride: 99 mEq/L (ref 98–109)
Creatinine: 1 mg/dL (ref 0.6–1.1)
EGFR: 69 mL/min/{1.73_m2} — ABNORMAL LOW (ref >90)
GLUCOSE: 109 mg/dL (ref 70–140)
POTASSIUM: 3.8 meq/L (ref 3.5–5.1)
Sodium: 142 mEq/L (ref 136–145)
Total Bilirubin: 0.42 mg/dL (ref 0.20–1.20)
Total Protein: 7.1 g/dL (ref 6.4–8.3)

## 2015-01-26 NOTE — Telephone Encounter (Signed)
cld pt & left message to adv of appt time & date per pof

## 2015-01-26 NOTE — Assessment & Plan Note (Signed)
Left breast cancer T4, N1, M0 stage III invasive lobular cancer status post surgery, adjuvant chemotherapy and radiation followed by antiestrogen therapy with tamoxifen.  Breast cancer surveillance: 1. Breast MRI 12/15/2014 is normal 2. Mammograms 12/15/2014 are normal 3. Breasts exam 01/26/2015 is normal   Recent fractures: Uncertain cause unlikely to be related to osteoporosis given the previous bone density tests have been normal.  Because she had invasive lobular cancer, we are monitoring her with mammograms and MRIs. Return to clinic in 1 year for follow-up.

## 2015-01-26 NOTE — Progress Notes (Signed)
Patient Care Team: Delphina Cahill, MD as PCP - General (Internal Medicine)  DIAGNOSIS: No matching staging information was found for the patient.  SUMMARY OF ONCOLOGIC HISTORY:   Breast cancer, left breast   04/09/2011 Surgery Left breast lumpectomy invasive lobular cancer grade 12.7 cm involving dermis and subcutaneous tissue the nipple, LVI and PNI present, LCIS, margins clear, 2 SLN +2 SLN isolated tumor cells, 4 AxLN negative ER 76% PR 93% Ki-67 18% HER-2 -ve ratio 1.15   05/16/2011 - 10/18/2011 Chemotherapy 4 cycles of dose dense a.c. completed on 07/16/2011. She is also status post 12 weeks of single agent Taxol   11/07/2011 - 12/13/2011 Radiation Therapy Radiation therapy to lumpectomy site   12/23/2011 -  Anti-estrogen oral therapy Tamoxifen 20 mg daily    CHIEF COMPLIANT: problem with right knee patellar fracture undergoing physical therapy, hot flashes  INTERVAL HISTORY: [name] is a 49 year old lady with above-mentioned history of left breast invasive lobular cancer treated with lumpectomy and axillary lymph node dissection. After chemotherapy and radiation she has been on oral antiestrogen therapy with tamoxifen since January 2013. She is tolerating tamoxifen fairly well other than occasional hot flashes and flushing sensation. She had broken her patella twice and his still recovering from it or the past 18 months. She is doing water aerobics and physical therapy.  REVIEW OF SYSTEMS:   Constitutional: Denies fevers, chills or abnormal weight loss Eyes: Denies blurriness of vision Ears, nose, mouth, throat, and face: Denies mucositis or sore throat Respiratory: Denies cough, dyspnea or wheezes Cardiovascular: Denies palpitation, chest discomfort or lower extremity swelling Gastrointestinal:  Denies nausea, heartburn or change in bowel habits Skin: Denies abnormal skin rashes Lymphatics: Denies new lymphadenopathy or easy bruising Neurological:Denies numbness, tingling or new  weaknesses Behavioral/Psych: Mood is stable, no new changes  Breast:  Breast feels tender along the surgical site and axilla All other systems were reviewed with the patient and are negative.  I have reviewed the past medical history, past surgical history, social history and family history with the patient and they are unchanged from previous note.  ALLERGIES:  is allergic to bactrim; dilaudid; and latex.  MEDICATIONS:  Current Outpatient Prescriptions  Medication Sig Dispense Refill  . ALPRAZolam (XANAX) 1 MG tablet Take 1 tablet (1 mg total) by mouth 2 (two) times daily as needed for anxiety or sleep. 90 tablet 0  . atenolol (TENORMIN) 50 MG tablet Take 50 mg by mouth daily.    . Cholecalciferol (VITAMIN D PO) Take 2 capsules by mouth daily.    Marland Kitchen docusate sodium (COLACE) 100 MG capsule Take 100 mg by mouth daily.    Marland Kitchen HYDROcodone-acetaminophen (NORCO/VICODIN) 5-325 MG per tablet Take 1-2 tablets by mouth every 6 (six) hours as needed for pain. 20 tablet 0  . ibuprofen (ADVIL,MOTRIN) 200 MG tablet Take 400 mg by mouth daily as needed for pain.    . Oxycodone HCl 10 MG TABS Take 5 mg by mouth 2 (two) times daily as needed (pain).    . tamoxifen (NOLVADEX) 20 MG tablet TAKE 1 TABLET BY MOUTH EVERY DAY 90 tablet 3  . triamterene-hydrochlorothiazide (DYAZIDE) 37.5-25 MG per capsule Take 1 capsule by mouth every morning.       No current facility-administered medications for this visit.    PHYSICAL EXAMINATION: ECOG PERFORMANCE STATUS: 1 - Symptomatic but completely ambulatory  Filed Vitals:   01/26/15 0953  BP: 131/85  Pulse: 73  Temp: 97.6 F (36.4 C)  Resp: 18  Filed Weights   01/26/15 0953  Weight: 153 lb 14.4 oz (69.809 kg)    GENERAL:alert, no distress and comfortable SKIN: skin color, texture, turgor are normal, no rashes or significant lesions EYES: normal, Conjunctiva are pink and non-injected, sclera clear OROPHARYNX:no exudate, no erythema and lips, buccal mucosa,  and tongue normal  NECK: supple, thyroid normal size, non-tender, without nodularity LYMPH:  no palpable lymphadenopathy in the cervical, axillary or inguinal LUNGS: clear to auscultation and percussion with normal breathing effort HEART: regular rate & rhythm and no murmurs and no lower extremity edema ABDOMEN:abdomen soft, non-tender and normal bowel sounds Musculoskeletal:no cyanosis of digits and no clubbing  NEURO: alert & oriented x 3 with fluent speech, no focal motor/sensory deficits BREAST: No palpable masses or nodules in either right or left breasts. No palpable axillary supraclavicular or infraclavicular adenopathy no breast tenderness or nipple discharge. (exam performed in the presence of a chaperone)  LABORATORY DATA:  I have reviewed the data as listed   Chemistry      Component Value Date/Time   NA 142 01/26/2015 0921   NA 140 09/24/2012 1039   K 3.8 01/26/2015 0921   K 4.1 09/24/2012 1039   CL 99 03/19/2013 1014   CL 102 09/24/2012 1039   CO2 30* 01/26/2015 0921   CO2 31 04/10/2012 1018   BUN 13.8 01/26/2015 0921   BUN 21 09/24/2012 1039   CREATININE 1.0 01/26/2015 0921   CREATININE 1.10 09/24/2012 1039      Component Value Date/Time   CALCIUM 9.4 01/26/2015 0921   CALCIUM 9.8 04/10/2012 1018   ALKPHOS 53 01/26/2015 0921   ALKPHOS 54 04/10/2012 1018   AST 20 01/26/2015 0921   AST 23 04/10/2012 1018   ALT 20 01/26/2015 0921   ALT 24 04/10/2012 1018   BILITOT 0.42 01/26/2015 0921   BILITOT 0.4 04/10/2012 1018       Lab Results  Component Value Date   WBC 6.9 01/26/2015   HGB 13.3 01/26/2015   HCT 40.7 01/26/2015   MCV 94.4 01/26/2015   PLT 241 01/26/2015   NEUTROABS 4.2 01/26/2015    ASSESSMENT & PLAN:  Breast cancer, left breast Left breast cancer T4, N1, M0 stage III invasive lobular cancer status post surgery, adjuvant chemotherapy and radiation followed by antiestrogen therapy with tamoxifen.  Breast cancer surveillance: 1. Breast MRI  12/15/2014 is normal 2. Mammograms 12/15/2014 are normal 3. Breasts exam 01/26/2015 is normal   Recent fractures: Uncertain cause unlikely to be related to osteoporosis given the previous bone density tests have been normal.  Because she had invasive lobular cancer, we are monitoring her with mammograms and MRIs. Return to clinic in 6 months for follow-up.    Orders Placed This Encounter  Procedures  . CBC with Differential    Standing Status: Future     Number of Occurrences:      Standing Expiration Date: 01/26/2016  . Comprehensive metabolic panel (Cmet) - [facility]    Standing Status: Future     Number of Occurrences:      Standing Expiration Date: 01/26/2016   The patient has a good understanding of the overall plan. she agrees with it. She will call with any problems that may develop before her next visit here.   Rulon Eisenmenger, MD

## 2015-03-07 ENCOUNTER — Telehealth: Payer: Self-pay | Admitting: Hematology and Oncology

## 2015-03-07 NOTE — Telephone Encounter (Signed)
Called and left message with new appt due to dr Lindi Adie on pal   anne

## 2015-05-05 ENCOUNTER — Other Ambulatory Visit: Payer: Self-pay | Admitting: Oncology

## 2015-05-05 NOTE — Telephone Encounter (Signed)
Last OV 01/26/15.  Next OV 07/24/15.  Chart reviewed.

## 2015-06-12 ENCOUNTER — Other Ambulatory Visit: Payer: Self-pay | Admitting: Orthopedic Surgery

## 2015-06-12 DIAGNOSIS — M545 Low back pain, unspecified: Secondary | ICD-10-CM

## 2015-06-13 ENCOUNTER — Ambulatory Visit
Admission: RE | Admit: 2015-06-13 | Discharge: 2015-06-13 | Disposition: A | Discharge status: Home / Self Care | Payer: BLUE CROSS/BLUE SHIELD | Source: Ambulatory Visit | Attending: Orthopedic Surgery | Admitting: Orthopedic Surgery

## 2015-06-13 ENCOUNTER — Other Ambulatory Visit: Payer: Self-pay | Admitting: Orthopedic Surgery

## 2015-06-13 ENCOUNTER — Ambulatory Visit
Admission: RE | Admit: 2015-06-13 | Discharge: 2015-06-13 | Disposition: A | Discharge status: Home / Self Care | Payer: Self-pay | Source: Ambulatory Visit | Attending: Orthopedic Surgery | Admitting: Orthopedic Surgery

## 2015-06-13 DIAGNOSIS — M545 Low back pain, unspecified: Secondary | ICD-10-CM

## 2015-06-13 MED ORDER — IOHEXOL 180 MG/ML  SOLN
1.0000 mL | Freq: Once | INTRAMUSCULAR | Status: AC | PRN
  Administered 2015-06-13: 1 mL via INTRA_ARTICULAR

## 2015-06-13 MED ORDER — METHYLPREDNISOLONE ACETATE 40 MG/ML INJ SUSP (RADIOLOG
120.0000 mg | Freq: Once | INTRAMUSCULAR | Status: AC
  Administered 2015-06-13: 120 mg via EPIDURAL

## 2015-06-13 NOTE — Discharge Instructions (Signed)

## 2015-06-27 ENCOUNTER — Encounter: Payer: Self-pay | Admitting: Genetic Counselor

## 2015-07-24 ENCOUNTER — Ambulatory Visit (HOSPITAL_BASED_OUTPATIENT_CLINIC_OR_DEPARTMENT_OTHER): Payer: BLUE CROSS/BLUE SHIELD | Admitting: Hematology and Oncology

## 2015-07-24 ENCOUNTER — Telehealth: Payer: Self-pay | Admitting: Hematology and Oncology

## 2015-07-24 ENCOUNTER — Encounter: Payer: Self-pay | Admitting: Hematology and Oncology

## 2015-07-24 ENCOUNTER — Other Ambulatory Visit (HOSPITAL_BASED_OUTPATIENT_CLINIC_OR_DEPARTMENT_OTHER): Payer: BLUE CROSS/BLUE SHIELD

## 2015-07-24 VITALS — BP 133/86 | HR 69 | Temp 98.0°F | Resp 18 | Ht 65.0 in | Wt 153.5 lb

## 2015-07-24 DIAGNOSIS — C773 Secondary and unspecified malignant neoplasm of axilla and upper limb lymph nodes: Secondary | ICD-10-CM | POA: Diagnosis not present

## 2015-07-24 DIAGNOSIS — C50912 Malignant neoplasm of unspecified site of left female breast: Secondary | ICD-10-CM

## 2015-07-24 DIAGNOSIS — S82009A Unspecified fracture of unspecified patella, initial encounter for closed fracture: Secondary | ICD-10-CM | POA: Diagnosis not present

## 2015-07-24 DIAGNOSIS — C50012 Malignant neoplasm of nipple and areola, left female breast: Secondary | ICD-10-CM

## 2015-07-24 LAB — CBC WITH DIFFERENTIAL/PLATELET
BASO%: 0.6 % (ref 0.0–2.0)
BASOS ABS: 0 10*3/uL (ref 0.0–0.1)
EOS%: 1.5 % (ref 0.0–7.0)
Eosinophils Absolute: 0.1 10*3/uL (ref 0.0–0.5)
HCT: 39.6 % (ref 34.8–46.6)
HGB: 13.2 g/dL (ref 11.6–15.9)
LYMPH%: 33.3 % (ref 14.0–49.7)
MCH: 31.9 pg (ref 25.1–34.0)
MCHC: 33.3 g/dL (ref 31.5–36.0)
MCV: 95.7 fL (ref 79.5–101.0)
MONO#: 0.5 10*3/uL (ref 0.1–0.9)
MONO%: 8.7 % (ref 0.0–14.0)
NEUT%: 55.9 % (ref 38.4–76.8)
NEUTROS ABS: 3.5 10*3/uL (ref 1.5–6.5)
Platelets: 202 10*3/uL (ref 145–400)
RBC: 4.14 10*6/uL (ref 3.70–5.45)
RDW: 12.7 % (ref 11.2–14.5)
WBC: 6.2 10*3/uL (ref 3.9–10.3)
lymph#: 2.1 10*3/uL (ref 0.9–3.3)

## 2015-07-24 LAB — COMPREHENSIVE METABOLIC PANEL (CC13)
ALBUMIN: 4 g/dL (ref 3.5–5.0)
ALK PHOS: 46 U/L (ref 40–150)
ALT: 25 U/L (ref 0–55)
AST: 21 U/L (ref 5–34)
Anion Gap: 10 mEq/L (ref 3–11)
BILIRUBIN TOTAL: 0.53 mg/dL (ref 0.20–1.20)
BUN: 16.2 mg/dL (ref 7.0–26.0)
CALCIUM: 9.7 mg/dL (ref 8.4–10.4)
CO2: 33 mEq/L — ABNORMAL HIGH (ref 22–29)
Chloride: 100 mEq/L (ref 98–109)
Creatinine: 0.9 mg/dL (ref 0.6–1.1)
EGFR: 76 mL/min/{1.73_m2} — ABNORMAL LOW (ref >90)
Glucose: 97 mg/dl (ref 70–140)
POTASSIUM: 4.2 meq/L (ref 3.5–5.1)
Sodium: 144 mEq/L (ref 136–145)
Total Protein: 6.9 g/dL (ref 6.4–8.3)

## 2015-07-24 NOTE — Progress Notes (Signed)
Patient Care Team: Delphina Cahill, MD as PCP - General (Internal Medicine)  DIAGNOSIS: No matching staging information was found for the patient.  SUMMARY OF ONCOLOGIC HISTORY:   Breast cancer, left breast   04/09/2011 Surgery Left breast lumpectomy invasive lobular cancer grade 12.7 cm involving dermis and subcutaneous tissue the nipple, LVI and PNI present, LCIS, margins clear, 2 SLN +2 SLN isolated tumor cells, 4 AxLN negative ER 76% PR 93% Ki-67 18% HER-2 -ve ratio 1.15   05/16/2011 - 10/18/2011 Chemotherapy 4 cycles of dose dense a.c. completed on 07/16/2011. She is also status post 12 weeks of single agent Taxol   11/07/2011 - 12/13/2011 Radiation Therapy Radiation therapy to lumpectomy site   12/23/2011 -  Anti-estrogen oral therapy Tamoxifen 20 mg daily    CHIEF COMPLIANT: Patient here for follow-up on tamoxifen therapy  INTERVAL HISTORY: [name] is a 49 year old with above-mentioned history of left breast cancer who underwent lumpectomy followed by adjuvant chemotherapy and radiation. She is currently on tamoxifen since 2013. She is tolerating it fairly well without any major problems. She complains of discomfort in the left breast especially cramps and spasms.  REVIEW OF SYSTEMS:   Constitutional: Denies fevers, chills or abnormal weight loss Eyes: Denies blurriness of vision Ears, nose, mouth, throat, and face: Denies mucositis or sore throat Respiratory: Denies cough, dyspnea or wheezes Cardiovascular: Denies palpitation, chest discomfort or lower extremity swelling Gastrointestinal:  Denies nausea, heartburn or change in bowel habits Skin: Denies abnormal skin rashes Lymphatics: Denies new lymphadenopathy or easy bruising Neurological:Denies numbness, tingling or new weaknesses Behavioral/Psych: Mood is stable, no new changes  Breast: Cramps and spasms in the left breast All other systems were reviewed with the patient and are negative.  I have reviewed the past medical  history, past surgical history, social history and family history with the patient and they are unchanged from previous note.  ALLERGIES:  is allergic to bactrim; dilaudid; and latex.  MEDICATIONS:  Current Outpatient Prescriptions  Medication Sig Dispense Refill  . ALPRAZolam (XANAX) 1 MG tablet Take 1 tablet (1 mg total) by mouth 2 (two) times daily as needed for anxiety or sleep. 90 tablet 0  . atenolol (TENORMIN) 100 MG tablet Take 100 mg by mouth daily.  3  . atenolol (TENORMIN) 50 MG tablet Take 50 mg by mouth daily.    . furosemide (LASIX) 20 MG tablet 1 TABLET BY MOUTH DAILY AS NEEDED (PRN) FOR SWELLING TAKE WITH POTASSIUM PILL.  2  . HYDROcodone-acetaminophen (NORCO/VICODIN) 5-325 MG per tablet Take 1-2 tablets by mouth every 6 (six) hours as needed for pain. 20 tablet 0  . ibuprofen (ADVIL,MOTRIN) 200 MG tablet Take 400 mg by mouth daily as needed for pain.    . Oxycodone HCl 10 MG TABS Take 5 mg by mouth 2 (two) times daily as needed (pain).    . potassium chloride (MICRO-K) 10 MEQ CR capsule     . tamoxifen (NOLVADEX) 20 MG tablet TAKE 1 TABLET BY MOUTH EVERY DAY 90 tablet 3  . triamterene-hydrochlorothiazide (DYAZIDE) 37.5-25 MG per capsule Take 1 capsule by mouth every morning.       No current facility-administered medications for this visit.    PHYSICAL EXAMINATION: ECOG PERFORMANCE STATUS: 1 - Symptomatic but completely ambulatory  Filed Vitals:   07/24/15 1100  BP: 133/86  Pulse: 69  Temp: 98 F (36.7 C)  Resp: 18   Filed Weights   07/24/15 1100  Weight: 153 lb 8 oz (69.627 kg)  GENERAL:alert, no distress and comfortable SKIN: skin color, texture, turgor are normal, no rashes or significant lesions EYES: normal, Conjunctiva are pink and non-injected, sclera clear OROPHARYNX:no exudate, no erythema and lips, buccal mucosa, and tongue normal  NECK: supple, thyroid normal size, non-tender, without nodularity LYMPH:  no palpable lymphadenopathy in the  cervical, axillary or inguinal LUNGS: clear to auscultation and percussion with normal breathing effort HEART: regular rate & rhythm and no murmurs and no lower extremity edema ABDOMEN:abdomen soft, non-tender and normal bowel sounds Musculoskeletal:no cyanosis of digits and no clubbing  NEURO: alert & oriented x 3 with fluent speech, no focal motor/sensory deficits BREAST: No palpable masses or nodules in either right or left breasts. No palpable axillary supraclavicular or infraclavicular adenopathy no breast tenderness or nipple discharge. (exam performed in the presence of a chaperone)  LABORATORY DATA:  I have reviewed the data as listed   Chemistry      Component Value Date/Time   NA 144 07/24/2015 1032   NA 140 09/24/2012 1039   K 4.2 07/24/2015 1032   K 4.1 09/24/2012 1039   CL 99 03/19/2013 1014   CL 102 09/24/2012 1039   CO2 33* 07/24/2015 1032   CO2 31 04/10/2012 1018   BUN 16.2 07/24/2015 1032   BUN 21 09/24/2012 1039   CREATININE 0.9 07/24/2015 1032   CREATININE 1.10 09/24/2012 1039      Component Value Date/Time   CALCIUM 9.7 07/24/2015 1032   CALCIUM 9.8 04/10/2012 1018   ALKPHOS 46 07/24/2015 1032   ALKPHOS 54 04/10/2012 1018   AST 21 07/24/2015 1032   AST 23 04/10/2012 1018   ALT 25 07/24/2015 1032   ALT 24 04/10/2012 1018   BILITOT 0.53 07/24/2015 1032   BILITOT 0.4 04/10/2012 1018       Lab Results  Component Value Date   WBC 6.2 07/24/2015   HGB 13.2 07/24/2015   HCT 39.6 07/24/2015   MCV 95.7 07/24/2015   PLT 202 07/24/2015   NEUTROABS 3.5 07/24/2015   ASSESSMENT & PLAN:  Breast cancer, left breast Left breast cancer T4, N1, M0 stage III invasive lobular cancer status post surgery, adjuvant chemotherapy and radiation followed by antiestrogen therapy with tamoxifen.  Breast cancer surveillance: 1. Breast MRI 12/15/2014 is normal 2. Mammograms 12/15/2014 are normal 3. Breasts exam 01/26/2015 is normal   Patellar fractures: Uncertain cause  unlikely to be related to osteoporosis given the previous bone density tests have been normal.  Because she had invasive lobular cancer, we are monitoring her with mammograms and MRIs which will be done in December 2016. Return to clinic in 1 year for follow-up with MRI breast and mammogram.  No orders of the defined types were placed in this encounter.   The patient has a good understanding of the overall plan. she agrees with it. she will call with any problems that may develop before the next visit here.   Rulon Eisenmenger, MD

## 2015-07-24 NOTE — Assessment & Plan Note (Signed)
Left breast cancer T4, N1, M0 stage III invasive lobular cancer status post surgery, adjuvant chemotherapy and radiation followed by antiestrogen therapy with tamoxifen.  Breast cancer surveillance: 1. Breast MRI 12/15/2014 is normal 2. Mammograms 12/15/2014 are normal 3. Breasts exam 01/26/2015 is normal   Recent fractures: Uncertain cause unlikely to be related to osteoporosis given the previous bone density tests have been normal.  Because she had invasive lobular cancer, we are monitoring her with mammograms and MRIs. Return to clinic in 6 months for follow-up with MRI breast and mammogram.

## 2015-07-24 NOTE — Telephone Encounter (Signed)
Patient is not at home yet,appointment made and calendar mailed to patient

## 2015-07-24 NOTE — Progress Notes (Signed)
Lab results dtd 05/01/15 rcvd from Dr. Wende Neighbors.  Reviewed by Dr. Lindi Adie.  Sent to scan.

## 2015-07-31 ENCOUNTER — Other Ambulatory Visit: Payer: BLUE CROSS/BLUE SHIELD

## 2015-07-31 ENCOUNTER — Ambulatory Visit: Payer: BLUE CROSS/BLUE SHIELD | Admitting: Hematology and Oncology

## 2015-11-13 ENCOUNTER — Telehealth (HOSPITAL_COMMUNITY): Payer: Self-pay | Admitting: Hematology and Oncology

## 2015-11-20 ENCOUNTER — Telehealth: Payer: Self-pay | Admitting: Hematology and Oncology

## 2015-11-20 ENCOUNTER — Other Ambulatory Visit: Payer: Self-pay | Admitting: Hematology and Oncology

## 2015-11-20 ENCOUNTER — Other Ambulatory Visit: Payer: Self-pay

## 2015-11-20 DIAGNOSIS — C50012 Malignant neoplasm of nipple and areola, left female breast: Secondary | ICD-10-CM

## 2015-11-20 DIAGNOSIS — Z853 Personal history of malignant neoplasm of breast: Secondary | ICD-10-CM

## 2015-11-20 NOTE — Telephone Encounter (Signed)
Spoke with patient and she is aware of her appointments and she disagrees with the appointment dates that dr Lindi Adie talked about and she will call the breast center for mammo and her mri  anne

## 2015-12-07 ENCOUNTER — Other Ambulatory Visit: Payer: Self-pay | Admitting: *Deleted

## 2015-12-07 NOTE — Telephone Encounter (Signed)
Reviewed with Dr. Lindi Adie, called patient and let her know that mammogram needed to be done in order for MRI to be read. She verbalized understanding and will schedule mammogram.

## 2015-12-07 NOTE — Telephone Encounter (Signed)
"  I met with Dr. Lindi Adie and discussed MRI being done Saturday December 16, 2015 same date as last years.  [facility] [employer] can do this on Saturday but insist I need mammogram before MRI."   "I want him to order MRI not in conjunction with mammogram.  Mammogram didn't show anything for my breast.  I also have met my deductible and will have to pay 10% or $75.00 for MRI on 12-16-2015.  If I do this later my cost is $750.00.  Return number for Ms. Gentz is [phone].

## 2016-02-29 ENCOUNTER — Other Ambulatory Visit (HOSPITAL_COMMUNITY): Payer: Self-pay | Admitting: Internal Medicine

## 2016-02-29 DIAGNOSIS — Z9889 Other specified postprocedural states: Secondary | ICD-10-CM

## 2016-02-29 DIAGNOSIS — Z853 Personal history of malignant neoplasm of breast: Secondary | ICD-10-CM

## 2016-03-01 ENCOUNTER — Other Ambulatory Visit: Payer: Self-pay

## 2016-03-01 DIAGNOSIS — C50412 Malignant neoplasm of upper-outer quadrant of left female breast: Secondary | ICD-10-CM | POA: Insufficient documentation

## 2016-03-01 NOTE — Progress Notes (Signed)
Let pt know mammo and ultrasound orders placed for [facility].  Pt voiced understanding.

## 2016-05-15 ENCOUNTER — Other Ambulatory Visit: Payer: Self-pay | Admitting: Hematology and Oncology

## 2016-06-25 ENCOUNTER — Other Ambulatory Visit: Payer: Self-pay | Admitting: Hematology and Oncology

## 2016-06-25 NOTE — Progress Notes (Signed)
Received call from pt who states she was trying to schedule her mammo and breast MRI at [facility]; however, she was told by their office that the proper orders were not in.  Pt states they told her there was no order for breast MRI.  Through chart review I saw orders in for both test and thus called GI to ensure these orders were what they needed.  GI confirmed these orders were sufficient and pt was able to call back to schedule imaging at her convenience.  I called pt back to inform her of this and she stated she would call and schedule.  I also reviewed next appointment with pt.  Pt without further questions or concerns at time of call.

## 2016-07-01 ENCOUNTER — Ambulatory Visit
Admission: RE | Admit: 2016-07-01 | Discharge: 2016-07-01 | Disposition: A | Discharge status: Home / Self Care | Payer: BLUE CROSS/BLUE SHIELD | Source: Ambulatory Visit | Attending: Hematology and Oncology | Admitting: Hematology and Oncology

## 2016-07-01 DIAGNOSIS — Z853 Personal history of malignant neoplasm of breast: Secondary | ICD-10-CM

## 2016-07-01 DIAGNOSIS — C50012 Malignant neoplasm of nipple and areola, left female breast: Secondary | ICD-10-CM

## 2016-07-01 MED ORDER — GADOBENATE DIMEGLUMINE 529 MG/ML IV SOLN
14.0000 mL | Freq: Once | INTRAVENOUS | Status: AC | PRN
  Administered 2016-07-01: 14 mL via INTRAVENOUS

## 2016-07-02 ENCOUNTER — Telehealth: Payer: Self-pay | Admitting: Hematology and Oncology

## 2016-07-02 NOTE — Telephone Encounter (Signed)
Pt called and requested that we mail her medical records to her residence.

## 2016-07-25 ENCOUNTER — Ambulatory Visit (HOSPITAL_BASED_OUTPATIENT_CLINIC_OR_DEPARTMENT_OTHER): Payer: BLUE CROSS/BLUE SHIELD | Admitting: Hematology and Oncology

## 2016-07-25 ENCOUNTER — Encounter: Payer: Self-pay | Admitting: Hematology and Oncology

## 2016-07-25 ENCOUNTER — Other Ambulatory Visit (HOSPITAL_BASED_OUTPATIENT_CLINIC_OR_DEPARTMENT_OTHER): Payer: BLUE CROSS/BLUE SHIELD

## 2016-07-25 ENCOUNTER — Other Ambulatory Visit: Payer: Self-pay

## 2016-07-25 DIAGNOSIS — C50412 Malignant neoplasm of upper-outer quadrant of left female breast: Secondary | ICD-10-CM | POA: Diagnosis not present

## 2016-07-25 DIAGNOSIS — C50912 Malignant neoplasm of unspecified site of left female breast: Secondary | ICD-10-CM

## 2016-07-25 LAB — CBC WITH DIFFERENTIAL/PLATELET
BASO%: 0.6 % (ref 0.0–2.0)
BASOS ABS: 0 10*3/uL (ref 0.0–0.1)
EOS%: 1.8 % (ref 0.0–7.0)
Eosinophils Absolute: 0.1 10*3/uL (ref 0.0–0.5)
HCT: 41.6 % (ref 34.8–46.6)
HEMOGLOBIN: 13.9 g/dL (ref 11.6–15.9)
LYMPH%: 30.1 % (ref 14.0–49.7)
MCH: 30.9 pg (ref 25.1–34.0)
MCHC: 33.4 g/dL (ref 31.5–36.0)
MCV: 92.4 fL (ref 79.5–101.0)
MONO#: 0.5 10*3/uL (ref 0.1–0.9)
MONO%: 6.4 % (ref 0.0–14.0)
NEUT%: 61.1 % (ref 38.4–76.8)
NEUTROS ABS: 4.4 10*3/uL (ref 1.5–6.5)
Platelets: 242 10*3/uL (ref 145–400)
RBC: 4.5 10*6/uL (ref 3.70–5.45)
RDW: 12.2 % (ref 11.2–14.5)
WBC: 7.2 10*3/uL (ref 3.9–10.3)
lymph#: 2.2 10*3/uL (ref 0.9–3.3)

## 2016-07-25 LAB — COMPREHENSIVE METABOLIC PANEL
ALBUMIN: 4.2 g/dL (ref 3.5–5.0)
ALK PHOS: 50 U/L (ref 40–150)
ALT: 27 U/L (ref 0–55)
AST: 25 U/L (ref 5–34)
Anion Gap: 11 mEq/L (ref 3–11)
BUN: 22.5 mg/dL (ref 7.0–26.0)
CO2: 30 meq/L — AB (ref 22–29)
Calcium: 9.7 mg/dL (ref 8.4–10.4)
Chloride: 100 mEq/L (ref 98–109)
Creatinine: 0.8 mg/dL (ref 0.6–1.1)
EGFR: 83 mL/min/{1.73_m2} — ABNORMAL LOW (ref >90)
GLUCOSE: 94 mg/dL (ref 70–140)
POTASSIUM: 3.7 meq/L (ref 3.5–5.1)
SODIUM: 141 meq/L (ref 136–145)
Total Bilirubin: 0.4 mg/dL (ref 0.20–1.20)
Total Protein: 7.6 g/dL (ref 6.4–8.3)

## 2016-07-25 MED ORDER — HYDROCODONE-ACETAMINOPHEN 10-500 MG PO TABS: 1.0000 | ORAL_TABLET | Freq: Four times a day (QID) | ORAL | 0 refills | Status: DC | PRN

## 2016-07-25 NOTE — Assessment & Plan Note (Deleted)
Left breast cancer T4, N1, M0 stage III invasive lobular cancer status post surgery, adjuvant chemotherapy and radiation followed by antiestrogen therapy with tamoxifen.  Breast cancer surveillance: 1. Breast MRI 07/01/2016 is normal 2. Mammograms 07/01/2016 are normal, breast density category B 3. Breasts exam 01/26/2015 is normal   Patellar fractures: Uncertain cause unlikely to be related to osteoporosis given the previous bone density tests have been normal.  Because she had invasive lobular cancer, we are monitoring her with mammograms and MRIs  Return to clinic in 1 year for follow-up with MRI breast and mammogram

## 2016-07-25 NOTE — Progress Notes (Signed)
Patient Care Team: Celene Squibb, MD as PCP - General (Internal Medicine)  DIAGNOSIS: No matching staging information was found for the patient.  SUMMARY OF ONCOLOGIC HISTORY:   Breast cancer, left breast ([place])   04/09/2011 Surgery    Left breast lumpectomy invasive lobular cancer grade 12.7 cm involving dermis and subcutaneous tissue the nipple, LVI and PNI present, LCIS, margins clear, 2 SLN +2 SLN isolated tumor cells, 4 AxLN negative ER 76% PR 93% Ki-67 18% HER-2 -ve ratio 1.15     05/16/2011 - 10/18/2011 Chemotherapy    4 cycles of dose dense a.c. completed on 07/16/2011. She is also status post 12 weeks of single agent Taxol     11/07/2011 - 12/13/2011 Radiation Therapy    Radiation therapy to lumpectomy site     12/23/2011 -  Anti-estrogen oral therapy    Tamoxifen 20 mg daily      CHIEF COMPLIANT: Follow-up to review the mammogram and breast MRI  INTERVAL HISTORY: [name] is a 50 year old with above-mentioned history of left breast cancer treated with lumpectomy followed by adjuvant chemotherapy and radiation and is currently on tamoxifen therapy. She is tolerating tamoxifen fairly well. She will finish 5 years of therapy by January 2018. She is interested in staying on the tamoxifen beyond 5 years. She reports no new problems or concerns. She continues to use a knee support because of her prior patellar fractures.  REVIEW OF SYSTEMS:   Constitutional: Denies fevers, chills or abnormal weight loss Eyes: Denies blurriness of vision Ears, nose, mouth, throat, and face: Denies mucositis or sore throat Respiratory: Denies cough, dyspnea or wheezes Cardiovascular: Denies palpitation, chest discomfort Gastrointestinal:  Denies nausea, heartburn or change in bowel habits Skin: Denies abnormal skin rashes Lymphatics: Denies new lymphadenopathy or easy bruising Neurological:Denies numbness, tingling or new weaknesses Behavioral/Psych: Mood is stable, no new changes    Extremities: No lower extremity edema Breast:  denies any pain or lumps or nodules in either breasts All other systems were reviewed with the patient and are negative.  I have reviewed the past medical history, past surgical history, social history and family history with the patient and they are unchanged from previous note.  ALLERGIES:  is allergic to bactrim; dilaudid [hydromorphone hcl]; and latex.  MEDICATIONS:  Current Outpatient Prescriptions  Medication Sig Dispense Refill  . ALPRAZolam (XANAX) 1 MG tablet Take 1 tablet (1 mg total) by mouth 2 (two) times daily as needed for anxiety or sleep. 90 tablet 0  . atenolol (TENORMIN) 100 MG tablet Take 100 mg by mouth daily.  3  . furosemide (LASIX) 20 MG tablet 1 TABLET BY MOUTH DAILY AS NEEDED (PRN) FOR SWELLING TAKE WITH POTASSIUM PILL.  2  . HYDROcodone-acetaminophen (LORTAB) 10-500 MG tablet Take 1 tablet by mouth every 6 (six) hours as needed for pain. 30 tablet 0  . ibuprofen (ADVIL,MOTRIN) 200 MG tablet Take 400 mg by mouth daily as needed for pain.    . Oxycodone HCl 10 MG TABS Take 5 mg by mouth 2 (two) times daily as needed (pain).    . potassium chloride (MICRO-K) 10 MEQ CR capsule     . tamoxifen (NOLVADEX) 20 MG tablet TAKE 1 TABLET BY MOUTH DAILY 90 tablet 3  . triamterene-hydrochlorothiazide (DYAZIDE) 37.5-25 MG per capsule Take 1 capsule by mouth every morning.       No current facility-administered medications for this visit.     PHYSICAL EXAMINATION: ECOG PERFORMANCE STATUS: 1 - Symptomatic but completely  ambulatory  Vitals:   07/25/16 1144  BP: 124/87  Pulse: 72  Resp: 18  Temp: 98.5 F (36.9 C)   Filed Weights   07/25/16 1144  Weight: 149 lb (67.6 kg)    GENERAL:alert, no distress and comfortable SKIN: skin color, texture, turgor are normal, no rashes or significant lesions EYES: normal, Conjunctiva are pink and non-injected, sclera clear OROPHARYNX:no exudate, no erythema and lips, buccal mucosa,  and tongue normal  NECK: supple, thyroid normal size, non-tender, without nodularity LYMPH:  no palpable lymphadenopathy in the cervical, axillary or inguinal LUNGS: clear to auscultation and percussion with normal breathing effort HEART: regular rate & rhythm and no murmurs and no lower extremity edema ABDOMEN:abdomen soft, non-tender and normal bowel sounds MUSCULOSKELETAL:no cyanosis of digits and no clubbing  NEURO: alert & oriented x 3 with fluent speech, no focal motor/sensory deficits EXTREMITIES: No lower extremity edema BREAST: No palpable masses or nodules in either right or left breasts. No palpable axillary supraclavicular or infraclavicular adenopathy no breast tenderness or nipple discharge. (exam performed in the presence of a chaperone)  LABORATORY DATA:  I have reviewed the data as listed   Chemistry      Component Value Date/Time   NA 141 07/25/2016 1128   K 3.7 07/25/2016 1128   CL 99 03/19/2013 1014   CO2 30 (H) 07/25/2016 1128   BUN 22.5 07/25/2016 1128   CREATININE 0.8 07/25/2016 1128      Component Value Date/Time   CALCIUM 9.7 07/25/2016 1128   ALKPHOS 50 07/25/2016 1128   AST 25 07/25/2016 1128   ALT 27 07/25/2016 1128   BILITOT 0.40 07/25/2016 1128       Lab Results  Component Value Date   WBC 7.2 07/25/2016   HGB 13.9 07/25/2016   HCT 41.6 07/25/2016   MCV 92.4 07/25/2016   PLT 242 07/25/2016   NEUTROABS 4.4 07/25/2016     ASSESSMENT & PLAN:  Malignant neoplasm of upper-outer quadrant of left female breast (HCC) Left breast cancer T4, N1, M0 stage III invasive lobular cancer status post surgery, adjuvant chemotherapy and radiation followed by antiestrogen therapy with tamoxifen.  Breast cancer surveillance: 1. Breast MRI 07/01/2016 is normal 2. Mammograms 07/01/2016 are normal, breast density category B 3. Breasts exam 01/26/2015 is normal   Patellar fractures: Uncertain cause unlikely to be related to osteoporosis given the previous  bone density tests have been normal.  Because she had invasive lobular cancer, we are monitoring her with mammograms and MRIs  Return to clinic in 1 year for follow-up with MRI breast and mammogram      Orders Placed This Encounter  Procedures  . MR Breast Bilateral W Wo Contrast    Standing Status:   Future    Standing Expiration Date:   09/24/2017    Order Specific Question:   If indicated for the ordered procedure, I authorize the administration of contrast media per Radiology protocol    Answer:   Yes    Order Specific Question:   Reason for Exam (SYMPTOM  OR DIAGNOSIS REQUIRED)    Answer:   Lobular breast cancer annual evaluation    Order Specific Question:   Preferred imaging location?    Answer:   [facility] (table limit-550lbs)    Order Specific Question:   What is the patient's sedation requirement?    Answer:   No Sedation    Order Specific Question:   Does the patient have a pacemaker or implanted devices?  Answer:   No   The patient has a good understanding of the overall plan. she agrees with it. she will call with any problems that may develop before the next visit here.   Rulon Eisenmenger, MD 07/25/16

## 2016-07-25 NOTE — Assessment & Plan Note (Signed)
Left breast cancer T4, N1, M0 stage III invasive lobular cancer status post surgery, adjuvant chemotherapy and radiation followed by antiestrogen therapy with tamoxifen.  Breast cancer surveillance: 1. Breast MRI 07/01/2016 is normal 2. Mammograms 07/01/2016 are normal, breast density category B 3. Breasts exam 01/26/2015 is normal   Patellar fractures: Uncertain cause unlikely to be related to osteoporosis given the previous bone density tests have been normal.  Because she had invasive lobular cancer, we are monitoring her with mammograms and MRIs  Return to clinic in 1 year for follow-up with MRI breast and mammogram

## 2016-07-26 ENCOUNTER — Telehealth: Payer: Self-pay | Admitting: *Deleted

## 2016-07-26 NOTE — Telephone Encounter (Signed)
Ordered BCI per Dr. Lindi Adie.  Faxed requisition to [facility].  Confirmed receipt.

## 2016-08-09 ENCOUNTER — Telehealth: Payer: Self-pay | Admitting: Hematology and Oncology

## 2016-08-09 ENCOUNTER — Encounter (HOSPITAL_COMMUNITY): Payer: Self-pay

## 2016-08-09 NOTE — Telephone Encounter (Signed)
Discussed BCI results Low risk No need to continue beyond 5 years (Jan 2018) Patient not sure if she wants to stop She has many questions regarding her diagnosis. She plans to discuss this when we meet again next year.

## 2016-08-12 ENCOUNTER — Telehealth: Payer: Self-pay | Admitting: *Deleted

## 2016-08-12 NOTE — Telephone Encounter (Signed)
Received BCI results of 1.5% Risk of Recurrence. Placed a copy on Dr. Geralyn Flash desk, gave Varney Biles a copy and took a copy to HIM to scan.

## 2016-11-18 ENCOUNTER — Ambulatory Visit
Admission: RE | Admit: 2016-11-18 | Discharge: 2016-11-18 | Disposition: A | Discharge status: Home / Self Care | Payer: BLUE CROSS/BLUE SHIELD | Source: Ambulatory Visit | Attending: Sports Medicine | Admitting: Sports Medicine

## 2016-11-18 ENCOUNTER — Ambulatory Visit (INDEPENDENT_AMBULATORY_CARE_PROVIDER_SITE_OTHER): Payer: BLUE CROSS/BLUE SHIELD | Admitting: Sports Medicine

## 2016-11-18 ENCOUNTER — Encounter: Payer: Self-pay | Admitting: Sports Medicine

## 2016-11-18 VITALS — BP 131/77 | HR 77 | Ht 65.0 in | Wt 145.0 lb

## 2016-11-18 DIAGNOSIS — M25561 Pain in right knee: Secondary | ICD-10-CM

## 2016-11-18 DIAGNOSIS — G8929 Other chronic pain: Secondary | ICD-10-CM

## 2016-11-18 NOTE — Progress Notes (Signed)
   Subjective:    Patient ID: [name], female    DOB: [birth year], 50 y.o.   MRN: [id number]  HPI chief complaint: Right knee pain  [name] is a 50 year old female that comes in today complaining of long-standing right knee pain. She initially suffered a patellar fracture in 2014 which required an ORIF. This was followed shortly thereafter in March 2015 by hardware removal. Unfortunately, in September 2015 she suffered a second patellar fracture requiring a second ORIF. Since that time she has had chronic pain and weakness in her right lower leg and right knee. She had several months of exhaustive physical therapy which was only somewhat helpful. She has to wear a rather large [employer] brace to help with knee stability, especially with hyperextension. She saw Dr. Maryjean Ka last week for a diagnostic left SI joint injection for her sacroiliitis. That injection has helped her some. She has a follow-up appointment with him again in 2 weeks. It is felt that her left hip sacroiliitis is secondary to an altered gait.   Past medical history is reviewed. It is pertinent for stage IV breast cancer for which she is in remission.   current medications reviewed Allergies reviewed    Review of Systems    As above Objective:   Physical Exam   well-developed, well-nourished. No acute distress. Awake alert and oriented 3. Vital signs reviewed  Right knee: Full range of motion. Trace patellofemoral crepitus. Surgical scars consistent with her prior ORIF. No effusion. No soft tissue swelling. The knee is hypersensitive to touch. No erythema. Although there appears to be atrophy upon gross inspection, she measures 48 cm in the mid thigh bilaterally. She has definite quad weakness. Her knee is stable to valgus and varus stressing. Negative anterior drawer, negative posterior drawer. Extensor mechanism is intact. Neurovascularly intact distally. Walking with a brace which is ill fitting.  X-rays of the right  knee including AP, lateral, and sunrise views show an old healed patellar fracture. Joint spaces appear to be intact. No effusion.       Assessment & Plan:   Chronic right knee pain status post ORIF 2 for patellar fractures Left hip sacroiliitis secondary to altered gait  I would like to start with getting the records from [place]. I would like the records as they pertain to both her right knee and her lumbar spine. I will arrange a follow-up visit after I review those records. In the meantime, she seems to have a good understanding of hip and knee strengthening exercises so I've encouraged her to continue with those. She can also use a better fitting brace. I will speak with our [employer] representative to see if we can arrange this. She will follow-up with Dr. Maryjean Ka per his request for her left hip sacroiliitis.

## 2016-11-27 ENCOUNTER — Ambulatory Visit: Payer: BLUE CROSS/BLUE SHIELD | Admitting: Sports Medicine

## 2016-12-02 ENCOUNTER — Encounter: Payer: Self-pay | Admitting: Sports Medicine

## 2016-12-02 ENCOUNTER — Ambulatory Visit: Payer: Self-pay

## 2016-12-02 ENCOUNTER — Ambulatory Visit (INDEPENDENT_AMBULATORY_CARE_PROVIDER_SITE_OTHER): Payer: BLUE CROSS/BLUE SHIELD | Admitting: Sports Medicine

## 2016-12-02 VITALS — BP 118/65 | Ht 65.0 in | Wt 145.0 lb

## 2016-12-02 DIAGNOSIS — M25561 Pain in right knee: Secondary | ICD-10-CM

## 2016-12-02 MED ORDER — NITROGLYCERIN 0.2 MG/HR TD PT24: MEDICATED_PATCH | TRANSDERMAL | 1 refills | Status: DC

## 2016-12-02 NOTE — Patient Instructions (Signed)

## 2016-12-02 NOTE — Progress Notes (Signed)
   Subjective:    Patient ID: [name], female    DOB: 03-11-1966, 50 y.o.   MRN: [id number]  HPI   Patient comes in today for follow-up on chronic right knee pain. I did get the records from [place]. An MRI of her right knee done in June 2016 showed severe chronic patellar tendinosis and moderate quadriceps tendinosis. Also evidence of her old healed patellar fracture. She has only mild degenerative changes throughout the rest of the knee. Since her last office visit, she has obtained a better fitting [employer] brace. She is here today for ultrasound evaluation of her patellar and quadriceps tendon.      Review of Systems     Objective:   Physical Exam  Well-developed, well-nourished. No acute distress  Right knee: Range of motion is 0-120 degrees. No effusion. Well-healed surgical incision. Her right quadriceps measures 40 cm mid thigh. Left quadriceps measures 44 cm mid thigh. Right proximal thigh measures 47.5 cm. Left proximal thigh measures 52 cm. Extensor mechanism is intact. She is tender to palpation along the anterior knee. Knee appears to be grossly stable to ligament exam. 4/5 quadriceps strength on the right. 5/5 on the left.  MSK ultrasound of the right knee was performed. Limited images of the patellar tendon and quadriceps tendon were obtained. There are diffuse hypoechoic changes through both tendons. Areas of calcification seen within the distal quadriceps tendon as well. No joint effusion. Findings are consistent with severe chronic tendinopathy of both the quadriceps tendon and patellar tendon.      Assessment & Plan:   Chronic patellar and quadriceps tendinopathy status post patellar fracture 2 Chronic quad atrophy secondary to #1  This is a chronic issue which will likely never return to normal. I've given the patient some isometric quad exercises with instructions to start doing them daily. We will also try nitroglycerin patch (quarter  patch) daily over her patellar tendon. I did explain to her that I cannot offer any guarantees that this will be successful, but I certainly think it's worth trying. I also think that if she could wear her [employer] brace a little less often, then that would help too. Follow-up with me in 6 weeks for reevaluation. I will plan on remeasuring her right quad. I did discuss the possibility of a nerve conduction study at some point just to make sure that there is not a neuropathic reason for her chronic atrophy. Of note, she did have an MRI of her lumbar spine in 2016 which showed some severe facet arthropathy at L4-L5 but no evidence of lumbar disc herniation. It is also important to note that I think the measurement of her right quadriceps done on her previous office visit was incorrect. She has obvious atrophy on gross inspection and today's measurements certainly support that.

## 2016-12-03 ENCOUNTER — Encounter: Payer: Self-pay | Admitting: Sports Medicine

## 2017-01-13 ENCOUNTER — Ambulatory Visit: Payer: BLUE CROSS/BLUE SHIELD | Admitting: Sports Medicine

## 2017-07-03 ENCOUNTER — Ambulatory Visit (INDEPENDENT_AMBULATORY_CARE_PROVIDER_SITE_OTHER): Payer: BLUE CROSS/BLUE SHIELD | Admitting: Sports Medicine

## 2017-07-03 ENCOUNTER — Encounter: Payer: Self-pay | Admitting: Sports Medicine

## 2017-07-03 VITALS — BP 128/80 | Ht 65.0 in | Wt 145.0 lb

## 2017-07-03 DIAGNOSIS — M25552 Pain in left hip: Secondary | ICD-10-CM | POA: Diagnosis not present

## 2017-07-03 NOTE — Progress Notes (Signed)
   Subjective:    Patient ID: [name], female    DOB: 03/09/1966, 51 y.o.   MRN: [id number]  HPI chief complaint: Left hip pain  Patient comes in today complaining of 5 months of left hip pain. Pain is diffuse throughout the hip. It is present with activity but is most severe at night. Symptoms began after she got the flu back in February. She's tried ibuprofen and diclofenac. She has chronic low back pain but her current hip pain is different in nature than what she has experienced previously. She denies problems with her left hip in the past. She denies numbness or tingling. No recent trauma. She has a history of stage IV breast cancer.     Review of Systems As above    Objective:   Physical Exam  Well-developed, well-nourished. No acute distress  Left hip: Smooth painless hip range of motion with a negative logroll. Severe pain with weightbearing. Pain with hop testing. No tenderness over the greater trochanteric bursa. Good strength. Negative straight leg raise.Negative FABER. Neurovascularly intact distally.      Assessment & Plan:   Left hip pain  Differential includes arthritis, post viral synovitis, iliopsoas tendinitis, labral tear, stress fracture, and metastatic breast cancer. Since she is having severe nighttime pain I think we should aggressively work this up with an MRI specifically to ensure that she has no spread of cancer to her left hip. Phone follow-up with the results of that study when available. We will delineate a more definitive treatment plan at that time. In the meantime, she will continue with either her diclofenac or her ibuprofen as needed for pain.

## 2017-07-17 ENCOUNTER — Other Ambulatory Visit: Payer: BLUE CROSS/BLUE SHIELD

## 2017-07-20 ENCOUNTER — Other Ambulatory Visit: Payer: BLUE CROSS/BLUE SHIELD

## 2017-07-20 ENCOUNTER — Ambulatory Visit
Admission: RE | Admit: 2017-07-20 | Discharge: 2017-07-20 | Disposition: A | Discharge status: Home / Self Care | Payer: BLUE CROSS/BLUE SHIELD | Source: Ambulatory Visit | Attending: Sports Medicine | Admitting: Sports Medicine

## 2017-07-23 ENCOUNTER — Telehealth: Payer: Self-pay | Admitting: Sports Medicine

## 2017-07-23 ENCOUNTER — Other Ambulatory Visit: Payer: Self-pay | Admitting: *Deleted

## 2017-07-23 DIAGNOSIS — S76012A Strain of muscle, fascia and tendon of left hip, initial encounter: Secondary | ICD-10-CM

## 2017-07-23 NOTE — Telephone Encounter (Signed)
I spoke with the patient on the phone today after reviewing the MRI of her left hip. She has a high-grade tear through the gluteus minimus tendon. Associated greater trochanteric bursitis as well. I recommended consultation with Dr. Ninfa Linden to discuss treatment options. Follow-up with me as needed.

## 2017-08-06 ENCOUNTER — Encounter (INDEPENDENT_AMBULATORY_CARE_PROVIDER_SITE_OTHER): Payer: Self-pay | Admitting: Orthopaedic Surgery

## 2017-08-06 ENCOUNTER — Ambulatory Visit (INDEPENDENT_AMBULATORY_CARE_PROVIDER_SITE_OTHER): Payer: BLUE CROSS/BLUE SHIELD | Admitting: Orthopaedic Surgery

## 2017-08-06 DIAGNOSIS — M7062 Trochanteric bursitis, left hip: Secondary | ICD-10-CM | POA: Diagnosis not present

## 2017-08-06 MED ORDER — LIDOCAINE HCL 1 % IJ SOLN
3.0000 mL | INTRAMUSCULAR | Status: AC | PRN
  Administered 2017-08-06: 3 mL

## 2017-08-06 MED ORDER — METHYLPREDNISOLONE ACETATE 40 MG/ML IJ SUSP
40.0000 mg | INTRAMUSCULAR | Status: AC | PRN
  Administered 2017-08-06: 40 mg via INTRA_ARTICULAR

## 2017-08-06 NOTE — Progress Notes (Signed)
Office Visit Note   Patient: [name]           Date of Birth: July 27, 1966           MRN: [id number] Visit Date: 08/06/2017              Requested by: Celene Squibb, MD [address] [place], [place] [address] PCP: Celene Squibb, MD   Assessment & Plan: Visit Diagnoses:  1. Trochanteric bursitis, left hip     Plan: I showed her her MRI as well as a hip model and explained in detail the disease process trochanteric bursitis as well as tendinosis of the hip abductor tendons. I want her try stretching exercises and I talked about a steroid injection in this area and she agreed to try this. Obviously if all else fails we can broach the idea of surgery in the future. I explained this to her as well. All questions were encouraged and answered. We'll see her back in 4 to see how she is responded to injection and stretching exercises. I also gave her topical anti-inflammatory samples to try.  Follow-Up Instructions: Return in about 4 weeks (around 09/03/2017).   Orders:  No orders of the defined types were placed in this encounter.  No orders of the defined types were placed in this encounter.     Procedures: Large Joint Inj Date/Time: 08/06/2017 3:11 PM Performed by: Mcarthur Rossetti Authorized by: Mcarthur Rossetti   Location:  Hip Site:  L greater trochanter Ultrasound Guidance: No   Fluoroscopic Guidance: No   Arthrogram: No   Medications:  3 mL lidocaine 1 %; 40 mg methylPREDNISolone acetate 40 MG/ML     Clinical Data: No additional findings.   Subjective: Chief Complaint  Patient presents with  . Left Hip - Pain  A shows a brand-new patient deformity. She is sent from Dr. Micheline Chapman with an MRI of her left hip. She's been having pain since about February of this year. She points the trochanteric areas a source of her pain. It hurts mainly on laying on that side and with activities as well. This is potentially a chronic problem having to deal with  a crushed knee an injury from her right knee that happens sometime ago. She denies a change in bowel or bladder function. She denies any groin pain. She has not had any type of injection. She is taking diclofenac as an anti-inflammatory.  HPI  Review of Systems He currently denies any headache, chest pain, short of breath, fever, chills, nausea, vomiting.  Objective: Vital Signs: LMP 07/09/2011   Physical Exam She is alert and oriented 3 and in no acute distress Ortho Exam Examination of her left hip shows fluid internal rotation rotation no pain in the groin. She is severely tender over the trochanteric area of her hip and it does radiate down the IT band. Her foot ankle examination is normal. Specialty Comments:  No specialty comments available.  Imaging: No results found. I independently reviewed the MRI of her pelvis and hip. She has significant trochanteric bursitis and fluid around the trochanteric area. There is significant tendinosis of the gluteus medius and minimus tendon was slight tearing in this as well.  PMFS History: Patient Active Problem List   Diagnosis Date Noted  . Trochanteric bursitis, left hip 08/06/2017  . Malignant neoplasm of upper-outer quadrant of left female breast ([facility]) 03/01/2016  . Diverticulitis of colon (without mention of hemorrhage)(562.11) 07/01/2013  . Radiation adverse effect  12/02/2011  . Breast cancer, left breast ([facility]) 10/18/2011  . FOOT SPRAIN 10/25/2008   Past Medical History:  Diagnosis Date  . Breast cancer, stage 3 ([facility]) 10/18/2011  . Breast cancer, stage 4 ([facility]) dx'd 03/12/11   left  . Cancer ([facility])    basal cell - skin  . Hypertension   . Radiation adverse effect 12/02/2011    Family History  Problem Relation Age of Onset  . Breast cancer Maternal Grandmother   . Lung cancer Paternal Grandfather     Past Surgical History:  Procedure Laterality Date  . AXILLARY LYMPH NODE DISSECTION    . DILATION AND CURETTAGE OF UTERUS     . LIPOMA EXCISION  5 years   upper back  . LYMPH NODE DISSECTION    . MASTECTOMY    . MASTECTOMY PARTIAL / LUMPECTOMY    . PORTACATH PLACEMENT    . portacath removal     Social History   Occupational History  . Not on file.   Social History Main Topics  . Smoking status: Former Research scientist (life sciences)  . Smokeless tobacco: Never Used     Comment: quit 5 yrs ago  . Alcohol use 0.0 oz/week    1 - 3 drink(s) per week     Comment: occasionally  . Drug use: No  . Sexual activity: Yes

## 2017-09-03 ENCOUNTER — Ambulatory Visit (INDEPENDENT_AMBULATORY_CARE_PROVIDER_SITE_OTHER): Payer: BLUE CROSS/BLUE SHIELD | Admitting: Orthopaedic Surgery

## 2017-10-17 ENCOUNTER — Other Ambulatory Visit: Payer: Self-pay | Admitting: Hematology and Oncology

## 2017-10-17 DIAGNOSIS — Z853 Personal history of malignant neoplasm of breast: Secondary | ICD-10-CM

## 2017-10-24 ENCOUNTER — Ambulatory Visit
Admission: RE | Admit: 2017-10-24 | Discharge: 2017-10-24 | Disposition: A | Discharge status: Home / Self Care | Payer: BLUE CROSS/BLUE SHIELD | Source: Ambulatory Visit | Attending: Hematology and Oncology | Admitting: Hematology and Oncology

## 2017-10-24 DIAGNOSIS — Z853 Personal history of malignant neoplasm of breast: Secondary | ICD-10-CM

## 2017-10-24 HISTORY — DX: Personal history of antineoplastic chemotherapy: Z92.21

## 2017-10-24 HISTORY — DX: Personal history of irradiation: Z92.3

## 2017-12-04 ENCOUNTER — Ambulatory Visit: Payer: BLUE CROSS/BLUE SHIELD | Admitting: Gastroenterology

## 2017-12-04 ENCOUNTER — Telehealth: Payer: Self-pay | Admitting: Gastroenterology

## 2017-12-04 ENCOUNTER — Encounter: Payer: Self-pay | Admitting: Gastroenterology

## 2017-12-04 NOTE — Telephone Encounter (Signed)
Patient was a no show and letter sent  °

## 2018-04-28 ENCOUNTER — Telehealth: Payer: Self-pay | Admitting: Hematology and Oncology

## 2018-04-28 NOTE — Telephone Encounter (Signed)
Patients dr's office called to schedule follow up visit with [facility].

## 2018-05-13 ENCOUNTER — Other Ambulatory Visit (HOSPITAL_COMMUNITY): Payer: Self-pay | Admitting: Respiratory Therapy

## 2018-05-13 DIAGNOSIS — R7981 Abnormal blood-gas level: Secondary | ICD-10-CM

## 2018-05-13 DIAGNOSIS — R0602 Shortness of breath: Secondary | ICD-10-CM

## 2018-05-14 ENCOUNTER — Inpatient Hospital Stay: Payer: BLUE CROSS/BLUE SHIELD | Attending: Hematology and Oncology | Admitting: Hematology and Oncology

## 2018-05-14 ENCOUNTER — Telehealth: Payer: Self-pay

## 2018-05-14 ENCOUNTER — Telehealth: Payer: Self-pay | Admitting: Hematology and Oncology

## 2018-05-14 DIAGNOSIS — C50412 Malignant neoplasm of upper-outer quadrant of left female breast: Secondary | ICD-10-CM

## 2018-05-14 DIAGNOSIS — Z923 Personal history of irradiation: Secondary | ICD-10-CM | POA: Insufficient documentation

## 2018-05-14 DIAGNOSIS — Z853 Personal history of malignant neoplasm of breast: Secondary | ICD-10-CM | POA: Insufficient documentation

## 2018-05-14 DIAGNOSIS — Z9221 Personal history of antineoplastic chemotherapy: Secondary | ICD-10-CM | POA: Diagnosis not present

## 2018-05-14 DIAGNOSIS — Z1231 Encounter for screening mammogram for malignant neoplasm of breast: Secondary | ICD-10-CM

## 2018-05-14 DIAGNOSIS — Z79899 Other long term (current) drug therapy: Secondary | ICD-10-CM | POA: Insufficient documentation

## 2018-05-14 DIAGNOSIS — Z170 Estrogen receptor positive status [ER+]: Secondary | ICD-10-CM

## 2018-05-14 MED ORDER — POTASSIUM CHLORIDE ER 10 MEQ PO CPCR: 20.0000 meq | ORAL_CAPSULE | Freq: Every day | ORAL | Status: DC

## 2018-05-14 NOTE — Telephone Encounter (Signed)
Scheduled apt per 5/30 los - gave patient aVS and calender per los.

## 2018-05-14 NOTE — Assessment & Plan Note (Signed)
Left breast cancer T4, N1, M0 stage III invasive lobular cancer status post surgery, adjuvant chemotherapy and radiation followed by antiestrogen therapy with tamoxifen.  Breast cancer surveillance: 1. Breast MRI 07/01/2016 is normal 2. Mammograms 10/24/2017 are normal, breast density category B 3. Breasts exam 05/14/2018 is normal   Patellar fractures: Uncertain cause unlikely to be related to osteoporosis given the previous bone density tests have been normal.  Because she had invasive lobular cancer, we are monitoring her with mammograms and MRIs  Return to clinic in 1 year for follow-up with MRI breast and mammogram

## 2018-05-14 NOTE — Telephone Encounter (Signed)
Received order for breast MRI to schedule for pt - expected date is 6/30- notified [facility] by inbox message and awaiting prior auth so it can be scheduled.

## 2018-05-14 NOTE — Progress Notes (Signed)
Patient Care Team: Celene Squibb, MD as PCP - General (Internal Medicine)  DIAGNOSIS:  Encounter Diagnoses  Name Primary?  . Malignant neoplasm of upper-outer quadrant of left breast in female, estrogen receptor positive ([facility])   . Encounter for screening mammogram for malignant neoplasm of breast     SUMMARY OF ONCOLOGIC HISTORY:   Malignant neoplasm of upper-outer quadrant of left breast in female, estrogen receptor positive ([facility])   04/09/2011 Surgery    Left breast lumpectomy invasive lobular cancer grade 12.7 cm involving dermis and subcutaneous tissue the nipple, LVI and PNI present, LCIS, margins clear, 2 SLN +2 SLN isolated tumor cells, 4 AxLN negative ER 76% PR 93% Ki-67 18% HER-2 -ve ratio 1.15      05/16/2011 - 10/18/2011 Chemotherapy    4 cycles of dose dense a.c. completed on 07/16/2011. She is also status post 12 weeks of single agent Taxol      11/07/2011 - 12/13/2011 Radiation Therapy    Radiation therapy to lumpectomy site      12/23/2011 - 01/14/2018 Anti-estrogen oral therapy    Tamoxifen 20 mg daily       CHIEF COMPLIANT: Follow-up after completion of tamoxifen therapy, surveillance of left breast cancer  INTERVAL HISTORY: [name] is a 52 year old with above-mentioned history of left breast cancer who is currently on surveillance.  She completed 5 years of antiestrogen therapy in January 2019.  She reports no new problems or concerns.  Denies any lumps or nodules in the breast.  REVIEW OF SYSTEMS:   Constitutional: Denies fevers, chills or abnormal weight loss Eyes: Denies blurriness of vision Ears, nose, mouth, throat, and face: Denies mucositis or sore throat Respiratory: Denies cough, dyspnea or wheezes Cardiovascular: Denies palpitation, chest discomfort Gastrointestinal:  Denies nausea, heartburn or change in bowel habits Skin: Denies abnormal skin rashes Lymphatics: Denies new lymphadenopathy or easy bruising Neurological:Denies  numbness, tingling or new weaknesses Behavioral/Psych: Mood is stable, no new changes  Extremities: No lower extremity edema Breast:  denies any pain or lumps or nodules in either breasts All other systems were reviewed with the patient and are negative.  I have reviewed the past medical history, past surgical history, social history and family history with the patient and they are unchanged from previous note.  ALLERGIES:  is allergic to bactrim; dilaudid [hydromorphone hcl]; and latex.  MEDICATIONS:  Current Outpatient Medications  Medication Sig Dispense Refill  . ALPRAZolam (XANAX) 1 MG tablet Take 1 tablet (1 mg total) by mouth 2 (two) times daily as needed for anxiety or sleep. 90 tablet 0  . atenolol (TENORMIN) 100 MG tablet Take 100 mg by mouth daily.  3  . furosemide (LASIX) 20 MG tablet 1 TABLET BY MOUTH DAILY AS NEEDED (PRN) FOR SWELLING TAKE WITH POTASSIUM PILL.  2  . HYDROcodone-acetaminophen (LORTAB) 10-500 MG tablet Take 1 tablet by mouth every 6 (six) hours as needed for pain. 30 tablet 0  . HYDROcodone-acetaminophen (NORCO) 10-325 MG tablet     . ibuprofen (ADVIL,MOTRIN) 200 MG tablet Take 400 mg by mouth daily as needed for pain.    . potassium chloride (MICRO-K) 10 MEQ CR capsule Take 2 capsules (20 mEq total) by mouth daily.    Marland Kitchen triamterene-hydrochlorothiazide (DYAZIDE) 37.5-25 MG per capsule Take 1 capsule by mouth every morning.       No current facility-administered medications for this visit.     PHYSICAL EXAMINATION: ECOG PERFORMANCE STATUS: 1 - Symptomatic but completely ambulatory  Vitals:  05/14/18 1104  BP: 135/82  Pulse: 81  Resp: 19  Temp: 98 F (36.7 C)  SpO2: 99%   Filed Weights   05/14/18 1104  Weight: 156 lb 9.6 oz (71 kg)    GENERAL:alert, no distress and comfortable SKIN: skin color, texture, turgor are normal, no rashes or significant lesions EYES: normal, Conjunctiva are pink and non-injected, sclera clear OROPHARYNX:no exudate, no  erythema and lips, buccal mucosa, and tongue normal  NECK: supple, thyroid normal size, non-tender, without nodularity LYMPH:  no palpable lymphadenopathy in the cervical, axillary or inguinal LUNGS: clear to auscultation and percussion with normal breathing effort HEART: regular rate & rhythm and no murmurs and no lower extremity edema ABDOMEN:abdomen soft, non-tender and normal bowel sounds MUSCULOSKELETAL:no cyanosis of digits and no clubbing  NEURO: alert & oriented x 3 with fluent speech, no focal motor/sensory deficits EXTREMITIES: No lower extremity edema BREAST: No palpable masses or nodules in either right or left breasts. No palpable axillary supraclavicular or infraclavicular adenopathy no breast tenderness or nipple discharge. (exam performed in the presence of a chaperone)  LABORATORY DATA:  I have reviewed the data as listed CMP Latest Ref Rng & Units 07/25/2016 07/24/2015 01/26/2015  Glucose 70 - 140 mg/dl 94 97 109  BUN 7.0 - 26.0 mg/dL 22.5 16.2 13.8  Creatinine 0.6 - 1.1 mg/dL 0.8 0.9 1.0  Sodium 136 - 145 mEq/L 141 144 142  Potassium 3.5 - 5.1 mEq/L 3.7 4.2 3.8  Chloride 98 - 107 mEq/L - - -  CO2 22 - 29 mEq/L 30(H) 33(H) 30(H)  Calcium 8.4 - 10.4 mg/dL 9.7 9.7 9.4  Total Protein 6.4 - 8.3 g/dL 7.6 6.9 7.1  Total Bilirubin 0.20 - 1.20 mg/dL 0.40 0.53 0.42  Alkaline Phos 40 - 150 U/L 50 46 53  AST 5 - 34 U/L _0 ALT 0 - 55 U/L _1 Lab Results  Component Value Date   WBC 7.2 07/25/2016   HGB 13.9 07/25/2016   HCT 41.6 07/25/2016   MCV 92.4 07/25/2016   PLT 242 07/25/2016   NEUTROABS 4.4 07/25/2016    ASSESSMENT & PLAN:  Malignant neoplasm of upper-outer quadrant of left breast in female, estrogen receptor positive ([facility]) Left breast cancer T4, N1, M0 stage III invasive lobular cancer status post surgery, adjuvant chemotherapy and radiation followed by antiestrogen therapy with tamoxifen.  Breast cancer surveillance: 1. Breast MRI 07/01/2016 is  normal 2. Mammograms 10/24/2017 are normal, breast density category B 3. Breasts exam 05/14/2018 is normal   Patellar fractures: Uncertain cause unlikely to be related to osteoporosis given the previous bone density tests have been normal.  Because she had invasive lobular cancer, we are monitoring her with mammograms and MRIs  She did not have a breast MRI this year.  I sent an order for a breast MRI to be done in a month.  I will call her with results of this test. Return to clinic in 1 year for follow-up       Orders Placed This Encounter  Procedures  . MR BREAST BILATERAL W WO CONTRAST INC CAD    Standing Status:   Future    Standing Expiration Date:   07/15/2019    Order Specific Question:   If indicated for the ordered procedure, I authorize the administration of contrast media per Radiology protocol    Answer:   Yes    Order Specific Question:   What is the patient's sedation requirement?  Answer:   No Sedation    Order Specific Question:   Does the patient have a pacemaker or implanted devices?    Answer:   No    Order Specific Question:   Radiology Contrast Protocol - do NOT remove file path    Answer:   [email or web address]    Order Specific Question:   Preferred imaging location?    Answer:   [facility] (table limit-550lbs)   The patient has a good understanding of the overall plan. she agrees with it. she will call with any problems that may develop before the next visit here.   Harriette Ohara, MD 05/14/18

## 2018-05-19 ENCOUNTER — Telehealth: Payer: Self-pay

## 2018-05-19 NOTE — Telephone Encounter (Signed)
Called pt to inform her of her MRI appt at [facility] on 06/30 at 1130. No further questions.  Cyndia Bent RN

## 2018-05-26 ENCOUNTER — Ambulatory Visit (HOSPITAL_COMMUNITY): Admission: RE | Admit: 2018-05-26 | Payer: BLUE CROSS/BLUE SHIELD | Source: Ambulatory Visit

## 2018-06-14 ENCOUNTER — Ambulatory Visit
Admission: RE | Admit: 2018-06-14 | Discharge: 2018-06-14 | Disposition: A | Discharge status: Home / Self Care | Payer: BLUE CROSS/BLUE SHIELD | Source: Ambulatory Visit | Attending: Hematology and Oncology | Admitting: Hematology and Oncology

## 2018-06-14 DIAGNOSIS — Z1231 Encounter for screening mammogram for malignant neoplasm of breast: Secondary | ICD-10-CM

## 2018-06-14 MED ORDER — GADOBENATE DIMEGLUMINE 529 MG/ML IV SOLN
15.0000 mL | Freq: Once | INTRAVENOUS | Status: AC | PRN
  Administered 2018-06-14: 15 mL via INTRAVENOUS

## 2018-06-15 ENCOUNTER — Other Ambulatory Visit: Payer: Self-pay | Admitting: *Deleted

## 2018-06-15 ENCOUNTER — Encounter (INDEPENDENT_AMBULATORY_CARE_PROVIDER_SITE_OTHER): Payer: Self-pay | Admitting: *Deleted

## 2018-06-15 DIAGNOSIS — R928 Other abnormal and inconclusive findings on diagnostic imaging of breast: Secondary | ICD-10-CM

## 2018-06-16 ENCOUNTER — Ambulatory Visit
Admission: RE | Admit: 2018-06-16 | Discharge: 2018-06-16 | Disposition: A | Discharge status: Home / Self Care | Payer: BLUE CROSS/BLUE SHIELD | Source: Ambulatory Visit | Attending: Hematology and Oncology | Admitting: Hematology and Oncology

## 2018-06-16 ENCOUNTER — Other Ambulatory Visit: Payer: Self-pay | Admitting: *Deleted

## 2018-06-16 ENCOUNTER — Other Ambulatory Visit: Payer: Self-pay | Admitting: Hematology and Oncology

## 2018-06-16 DIAGNOSIS — R928 Other abnormal and inconclusive findings on diagnostic imaging of breast: Secondary | ICD-10-CM

## 2018-06-17 ENCOUNTER — Ambulatory Visit
Admission: RE | Admit: 2018-06-17 | Discharge: 2018-06-17 | Disposition: A | Discharge status: Home / Self Care | Payer: BLUE CROSS/BLUE SHIELD | Source: Ambulatory Visit | Attending: Hematology and Oncology | Admitting: Hematology and Oncology

## 2018-06-17 DIAGNOSIS — R928 Other abnormal and inconclusive findings on diagnostic imaging of breast: Secondary | ICD-10-CM

## 2018-06-17 MED ORDER — GADOBENATE DIMEGLUMINE 529 MG/ML IV SOLN
15.0000 mL | Freq: Once | INTRAVENOUS | Status: AC | PRN
  Administered 2018-06-17: 15 mL via INTRAVENOUS

## 2018-08-11 ENCOUNTER — Ambulatory Visit (INDEPENDENT_AMBULATORY_CARE_PROVIDER_SITE_OTHER): Payer: BLUE CROSS/BLUE SHIELD | Admitting: Sports Medicine

## 2018-08-11 VITALS — BP 110/78 | Ht 65.0 in | Wt 150.0 lb

## 2018-08-11 DIAGNOSIS — M7651 Patellar tendinitis, right knee: Secondary | ICD-10-CM | POA: Diagnosis not present

## 2018-08-11 DIAGNOSIS — M25561 Pain in right knee: Secondary | ICD-10-CM

## 2018-08-11 DIAGNOSIS — G8929 Other chronic pain: Secondary | ICD-10-CM | POA: Diagnosis not present

## 2018-08-11 MED ORDER — NITROGLYCERIN 0.2 MG/HR TD PT24: MEDICATED_PATCH | TRANSDERMAL | 1 refills | Status: DC

## 2018-08-11 NOTE — Patient Instructions (Addendum)
Thanks for coming to see [place] today in clinic.  He was seen today for follow-up of your right knee.  We discussed today need to continue to do isometric quad exercises for strengthening of your right quadricep.  We also provided with double upright knee brace today.  This should only be worn when you are doing strenuous walking up and down hills.  This should not be worn in day-to-day life.  We also provided you with Nitropatch, with instructions noted as below.  We will see back in 6 weeks for follow-up.   Nitroglycerin Protocol   Apply 1/4 nitroglycerin patch to affected area daily.  Change position of patch within the affected area every 24 hours.  You may experience a headache during the first 1-2 weeks of using the patch, these should subside.  If you experience headaches after beginning nitroglycerin patch treatment, you may take your preferred over the counter pain reliever.  Another side effect of the nitroglycerin patch is skin irritation or rash related to patch adhesive.  Please notify our office if you develop more severe headaches or rash, and stop the patch.  Tendon healing with nitroglycerin patch may require 12 to 24 weeks depending on the extent of injury.  Men should not use if taking Viagra, Cialis, or Levitra.   Do not use if you have migraines or rosacea.

## 2018-08-11 NOTE — Progress Notes (Signed)
HPI  CC: Right knee pain  [name] is a 52 year old female who presents today for right knee pain.  This knee pain is been going on since 2014 when she had a fracture of her patella.  She had this repaired, and then had a revision of this repair a year later.  Since that time, she has had pain in her right knee when she walks prolonged distances.  She states it is made worse when she is going upstairs.  She states she is unable to go downstairs at all.  She was previously wearing a DonJoy brace basically all the time.  For the past year, she has been out of his brace.  She states she has been trying to walk more to strengthen her right quadriceps.  She has not been doing isometric exercises that were provided at last visit.  She tried Nitropatch 1 year ago over her patella tendon, and states this did provide her with some relief.  She denies any numbness and tingling of the leg.  She denies any weakness of the leg.  She states her knee does feel unstable when she is going up and down stairs.  She is been taking hydrocodone as needed for pain relief for her knee.  She has no new trauma to the area.  She denies any new injury to the area.  See HPI and/or previous note for associated ROS.  Objective: BP 110/78   Ht 5\' 5"  (1.651 m)   Wt 150 lb (68 kg)   LMP 07/09/2011   BMI 24.96 kg/m  Gen: NAD, well groomed, a/o x3, normal affect.  CV: Well-perfused. Warm.  Resp: Non-labored.  Neuro: Sensation intact throughout. No gross coordination deficits.  Gait: Nonpathologic posture, unremarkable stride without signs of limp or balance issues.  Right knee exam: No erythema, warmth.  Mild swelling along the patella tendon.  Well-healed surgical site.  Right quadricep measuring 39.5 cm at mid thigh, left quadricep measuring 43 cm at mid thigh.  Tenderness to palpation along the medial joint line, tenderness to palpation along the tibial tuberosity, tenderness to palpation over the patella.  Full range of motion  in flexion and extension.  There is some pain with terminal ends of both sides of range of motion.  Crepitus noted through range of motion.  Strength 5 out of 5 throughout testing.  Negative Lockman testing, negative posterior drawer, some laxity in varus testing, negative valgus testing.  Negative McMurray testing.  Assessment and plan: 1. Chronic right knee pain, with history of ORIF x2 for patellar fractures. 2.  Chronic patellar tendinopathy, secondary to above issue.  Gust multiple treatment options today with ago.  We will start her back on the Nitropatch protocol for the next 2 months.  We have provided her with isometric quad exercises again at this visit.  We have advised her that doing these exercises should help stabilize the patellar tendon and may be help with some of the pain she is having.  She would like a brace for walking up and down hills around her land.  We offered her double upright brace at today's visit, but she states this does not provide her much comfort.  She states she would like to shop around for other kinds of braces.  We provided her with some options to for shopping around today.  We will see her back in 6 weeks for follow-up.  If she has no improvement at visit, she may need to be placed in formal  physical therapy for quad strengthening.  It may be worthwhile doing an intra-articular knee injection as well, as she likely has some underlying arthritis at this time.  Lewanda Rife, MD [facility] Fellow 08/11/2018 10:45 AM   Patient seen and evaluated with the sports medicine fellow. I agree with the above plan of care. Follow-up in 6 weeks for reevaluation.

## 2019-05-11 ENCOUNTER — Telehealth: Payer: Self-pay | Admitting: Hematology and Oncology

## 2019-05-11 NOTE — Assessment & Plan Note (Signed)
Left breast cancer T4, N1, M0 stage III invasive lobular cancer status post surgery, adjuvant chemotherapy and radiation followed by antiestrogen therapy with tamoxifen.  Breast cancer surveillance: 1. Breast MRI 06/14/2018: Indeterminate 7 mm mass posterior aspect of the right breast, suspicious: Biopsy revealed PASH and fibrocystic changes 2. Mammograms 10/24/2017 are normal, breast density category B 3. Breasts exam 05/14/2018 is normal   Patellar fractures: Uncertain cause unlikely to be related to osteoporosis given the previous bone density tests have been normal.  Because she had invasive lobular cancer, we are monitoring her with mammograms and MRIs. I discussed with patient that she will need annual MRIs and mammograms.  These will need to be set up.

## 2019-05-11 NOTE — Telephone Encounter (Signed)
Called patient regarding upcoming [facility] appointment, per patient's request this appointment will be a doximity call.

## 2019-05-13 ENCOUNTER — Telehealth: Payer: Self-pay | Admitting: Hematology and Oncology

## 2019-05-13 NOTE — Progress Notes (Signed)
HEMATOLOGY-ONCOLOGY DOXIMITY VISIT PROGRESS NOTE  I connected with [name] on 05/14/2019 at 11:15 AM EDT by Doximity video conference and verified that I am speaking with the correct person using two identifiers.  I discussed the limitations, risks, security and privacy concerns of performing an evaluation and management service by Doximity and the availability of in person appointments.  I also discussed with the patient that there may be a patient responsible charge related to this service. The patient expressed understanding and agreed to proceed.  Patient's Location: Home Physician Location: Clinic  CHIEF COMPLIANT: Surveillance of left breast cancer  INTERVAL HISTORY: [name] is a 53 y.o. female with above-mentioned history of left breast cancer underwent lumpectomy, chemotherapy, radiation, and completed 6 years of tamoxifen therapy. She is currently on surveillance. I last saw her a year ago. Breast MRI on 06/14/18 showed a 72m mass in the right breast suspicious for malignancy. [place] 06/16/18 showed no evidence of malignancy. Biopsy on 06/17/18 showed no evidence of malignancy. She presents today over Doximity for annual follow-up.     Malignant neoplasm of upper-outer quadrant of left breast in [place], [place] positive [place]   04/09/2011 Surgery    Left breast lumpectomy invasive lobular cancer grade 12.7 cm involving dermis and subcutaneous tissue the nipple, LVI and PNI present, LCIS, margins clear, 2 SLN +2 SLN isolated tumor cells, 4 AxLN negative ER 76% PR 93% Ki-67 18% HER-2 -ve ratio 1.15    05/16/2011 - 10/18/2011 Chemotherapy    4 cycles of dose dense a.c. completed on 07/16/2011. She is also status post 12 weeks of single agent Taxol    11/07/2011 - 12/13/2011 Radiation Therapy    Radiation therapy to lumpectomy site    12/23/2011 - 01/14/2018 Anti-estrogen oral therapy    Tamoxifen 20 mg daily     REVIEW OF SYSTEMS:   Constitutional:  Denies fevers, chills or abnormal weight loss Eyes: Denies blurriness of vision Ears, nose, mouth, throat, and face: Denies mucositis or sore throat Respiratory: Denies cough, dyspnea or wheezes Cardiovascular: Denies palpitation, chest discomfort Gastrointestinal:  Denies nausea, heartburn or change in bowel habits Skin: Denies abnormal skin rashes Lymphatics: Denies new lymphadenopathy or easy bruising Neurological:Denies numbness, tingling or new weaknesses Behavioral/Psych: Mood is stable, no new changes  Extremities: No lower extremity edema Breast: denies any pain or lumps or nodules in either breasts All other systems were reviewed with the patient and are negative.  Observations/Objective:  There were no vitals filed for this visit. There is no height or weight on file to calculate BMI.  I have reviewed the data as listed CMP Latest Ref Rng & Units 07/25/2016 07/24/2015 01/26/2015  Glucose 70 - 140 mg/dl 94 97 109  BUN 7.0 - 26.0 mg/dL 22.5 16.2 13.8  Creatinine 0.6 - 1.1 mg/dL 0.8 0.9 1.0  Sodium 136 - 145 mEq/L 141 144 142  Potassium 3.5 - 5.1 mEq/L 3.7 4.2 3.8  Chloride 98 - 107 mEq/L - - -  CO2 22 - 29 mEq/L 30(H) 33(H) 30(H)  Calcium 8.4 - 10.4 mg/dL 9.7 9.7 9.4  Total Protein 6.4 - 8.3 g/dL 7.6 6.9 7.1  Total Bilirubin 0.20 - 1.20 mg/dL 0.40 0.53 0.42  Alkaline Phos 40 - 150 U/L 50 46 53  AST 5 - 34 U/L '25 21 20  '$ ALT 0 - 55 U/L '27 25 20    '$ Lab Results  Component Value Date   WBC 7.2 07/25/2016   HGB 13.9 07/25/2016  HCT 41.6 07/25/2016   MCV 92.4 07/25/2016   PLT 242 07/25/2016   NEUTROABS 4.4 07/25/2016      Assessment Plan:  Malignant neoplasm of upper-outer quadrant of left breast in female, estrogen receptor positive ([place]) Left breast cancer T4, N1, M0 stage III invasive lobular cancer status post surgery, adjuvant chemotherapy and radiation followed by antiestrogen therapy with tamoxifen.  Breast cancer surveillance: 1. Breast MRI 06/14/2018:  Indeterminate 7 mm mass posterior aspect of the right breast, suspicious: Biopsy revealed PASH and fibrocystic changes 2. Mammograms 10/24/2017 are normal, breast density category B, patient will need a new mammogram 3. Breasts exam 05/14/2018 is normal   Patellar fractures: Uncertain cause unlikely to be related to osteoporosis given the previous bone density tests have been normal.  Because she had invasive lobular cancer, we are monitoring her with mammograms and MRIs. I discussed with patient that she will need annual MRIs and mammograms.  These will need to be set up.     I discussed the assessment and treatment plan with the patient. The patient was provided an opportunity to ask questions and all were answered. The patient agreed with the plan and demonstrated an understanding of the instructions. The patient was advised to call back or seek an in-person evaluation if the symptoms worsen or if the condition fails to improve as anticipated.   I provided 15 minutes of face-to-face Doximity time during this encounter.    Rulon Eisenmenger, MD 05/14/2019   I, Molly Dorshimer, am acting as scribe for Nicholas Lose, MD.  I have reviewed the above documentation for accuracy and completeness, and I agree with the above.    This encounter was created in error - please disregard.

## 2019-05-14 ENCOUNTER — Inpatient Hospital Stay: Payer: BLUE CROSS/BLUE SHIELD | Attending: Hematology and Oncology | Admitting: Hematology and Oncology

## 2019-05-14 DIAGNOSIS — Z170 Estrogen receptor positive status [ER+]: Secondary | ICD-10-CM

## 2019-05-14 DIAGNOSIS — C50412 Malignant neoplasm of upper-outer quadrant of left female breast: Secondary | ICD-10-CM

## 2020-01-27 DIAGNOSIS — H903 Sensorineural hearing loss, bilateral: Secondary | ICD-10-CM | POA: Diagnosis not present

## 2020-01-27 DIAGNOSIS — H6903 Patulous Eustachian tube, bilateral: Secondary | ICD-10-CM | POA: Diagnosis not present

## 2020-04-03 ENCOUNTER — Other Ambulatory Visit: Payer: Self-pay | Admitting: Hematology and Oncology

## 2020-04-03 DIAGNOSIS — Z1231 Encounter for screening mammogram for malignant neoplasm of breast: Secondary | ICD-10-CM

## 2020-04-19 DIAGNOSIS — G894 Chronic pain syndrome: Secondary | ICD-10-CM | POA: Diagnosis not present

## 2020-04-19 DIAGNOSIS — G4700 Insomnia, unspecified: Secondary | ICD-10-CM | POA: Diagnosis not present

## 2020-05-16 DIAGNOSIS — E876 Hypokalemia: Secondary | ICD-10-CM | POA: Diagnosis not present

## 2020-05-16 DIAGNOSIS — R7301 Impaired fasting glucose: Secondary | ICD-10-CM | POA: Diagnosis not present

## 2020-05-16 DIAGNOSIS — E782 Mixed hyperlipidemia: Secondary | ICD-10-CM | POA: Diagnosis not present

## 2020-05-16 DIAGNOSIS — C50919 Malignant neoplasm of unspecified site of unspecified female breast: Secondary | ICD-10-CM | POA: Diagnosis not present

## 2020-05-23 DIAGNOSIS — E782 Mixed hyperlipidemia: Secondary | ICD-10-CM | POA: Diagnosis not present

## 2020-05-23 DIAGNOSIS — R945 Abnormal results of liver function studies: Secondary | ICD-10-CM | POA: Diagnosis not present

## 2020-05-23 DIAGNOSIS — R944 Abnormal results of kidney function studies: Secondary | ICD-10-CM | POA: Diagnosis not present

## 2020-07-10 ENCOUNTER — Encounter: Payer: Self-pay | Admitting: *Deleted

## 2020-07-11 ENCOUNTER — Encounter: Payer: Self-pay | Admitting: Cardiology

## 2020-07-11 ENCOUNTER — Ambulatory Visit (INDEPENDENT_AMBULATORY_CARE_PROVIDER_SITE_OTHER): Payer: Medicare Other | Admitting: Cardiology

## 2020-07-11 VITALS — BP 127/86 | HR 80 | Ht 65.0 in | Wt 152.0 lb

## 2020-07-11 DIAGNOSIS — R0602 Shortness of breath: Secondary | ICD-10-CM

## 2020-07-11 DIAGNOSIS — R0789 Other chest pain: Secondary | ICD-10-CM | POA: Diagnosis not present

## 2020-07-11 NOTE — Progress Notes (Signed)
Clinical Summary [name] is a 54 y.o.female seen today as a new patient for the following medical problems.    1. Chest pain - episodes several weeks ago - sharp pain left chest into left upper back. 8/10 in severity. No other associated. Worst with deep breathing. Pain lasted a few minutes, about 20 minutes. Had a similar episode few days later. - symptoms have resolved  CAD risk factors: HTN, hyperlipidemia, former smoke x 25 years,   2. SOB - ongoing 3-6 months - can occur at rest or exertion - sporadic episodes - sedentary lifestyle due to chronic joint pains.  - can have some LE edema at times, she is on lasix daily. If missing doses significant weight gain - some coughing mainly in the morning.    - history of breast CA, prior radiation and chemo   2. Dizziness - from pcp note was having some low bp's, bp meds lowered  3. Hyperlipidemia - refused medical therapy   Past Medical History:  Diagnosis Date  . Breast cancer, stage 3 ([place]) 10/18/2011  . Breast cancer, stage 4 ([place]) dx'd 03/12/11   left  . Cancer ([place])    basal cell - skin  . Hypertension   . Personal history of chemotherapy   . Personal history of radiation therapy   . Radiation adverse effect 12/02/2011     Allergies  Allergen Reactions  . Bactrim Diarrhea and Nausea And Vomiting  . Dilaudid [Hydromorphone Hcl] Rash  . Latex Itching and Rash     Current Outpatient Medications  Medication Sig Dispense Refill  . ALPRAZolam (XANAX) 1 MG tablet Take 1 tablet (1 mg total) by mouth 2 (two) times daily as needed for anxiety or sleep. 90 tablet 0  . atenolol (TENORMIN) 100 MG tablet Take 100 mg by mouth daily.  3  . furosemide (LASIX) 20 MG tablet 1 TABLET BY MOUTH DAILY AS NEEDED (PRN) FOR SWELLING TAKE WITH POTASSIUM PILL.  2  . HYDROcodone-acetaminophen (LORTAB) 10-500 MG tablet Take 1 tablet by mouth every 6 (six) hours as needed for pain. 30 tablet 0  . HYDROcodone-acetaminophen  (NORCO) 10-325 MG tablet     . ibuprofen (ADVIL,MOTRIN) 200 MG tablet Take 400 mg by mouth daily as needed for pain.    . nitroGLYCERIN (NITRODUR - DOSED IN MG/24 HR) 0.2 mg/hr patch Use 1/4 patch daily to the affected area. 30 patch 1  . potassium chloride (MICRO-K) 10 MEQ CR capsule Take 2 capsules (20 mEq total) by mouth daily.    Marland Kitchen triamterene-hydrochlorothiazide (DYAZIDE) 37.5-25 MG per capsule Take 1 capsule by mouth every morning.       No current facility-administered medications for this visit.     Past Surgical History:  Procedure Laterality Date  . AXILLARY LYMPH NODE DISSECTION    . BREAST BIOPSY    . DILATION AND CURETTAGE OF UTERUS    . LIPOMA EXCISION  5 years   upper back  . LYMPH NODE DISSECTION    . MASTECTOMY    . MASTECTOMY PARTIAL / LUMPECTOMY    . PORTACATH PLACEMENT    . portacath removal       Allergies  Allergen Reactions  . Bactrim Diarrhea and Nausea And Vomiting  . Dilaudid [Hydromorphone Hcl] Rash  . Latex Itching and Rash      Family History  Problem Relation Age of Onset  . Breast cancer Maternal Grandmother   . Lung cancer Paternal Grandfather   . Breast cancer  Paternal Grandmother      Social History [name] reports that she has quit smoking. She has never used smokeless tobacco. [name] reports current alcohol use of about 1.0 - 3.0 standard drink of alcohol per week.   Review of Systems CONSTITUTIONAL: No weight loss, fever, chills, weakness or fatigue.  HEENT: Eyes: No visual loss, blurred vision, double vision or yellow sclerae.No hearing loss, sneezing, congestion, runny nose or sore throat.  SKIN: No rash or itching.  CARDIOVASCULAR: per hpi RESPIRATORY: per hpi GASTROINTESTINAL: No anorexia, nausea, vomiting or diarrhea. No abdominal pain or blood.  GENITOURINARY: No burning on urination, no polyuria NEUROLOGICAL: No headache, dizziness, syncope, paralysis, ataxia, numbness or tingling in the extremities. No  change in bowel or bladder control.  MUSCULOSKELETAL: No muscle, back pain, joint pain or stiffness.  LYMPHATICS: No enlarged nodes. No history of splenectomy.  PSYCHIATRIC: No history of depression or anxiety.  ENDOCRINOLOGIC: No reports of sweating, cold or heat intolerance. No polyuria or polydipsia.  Marland Kitchen   Physical Examination Today's Vitals   07/11/20 1043  BP: (!) 127/86  Pulse: 80  SpO2: 98%  Weight: 152 lb (68.9 kg)  Height: 5\' 5"  (1.651 m)   Body mass index is 25.29 kg/m.  Gen: resting comfortably, no acute distress HEENT: no scleral icterus, pupils equal round and reactive, no palptable cervical adenopathy,  CV: RRR, no m/r/g, no jvd Resp: Clear to auscultation bilaterally GI: abdomen is soft, non-tender, non-distended, normal bowel sounds, no hepatosplenomegaly MSK: extremities are warm, no edema.  Skin: warm, no rash Neuro:  no focal deficits Psych: appropriate affect      Assessment and Plan  1. Chest pain - not consistent with cardiac chest pain, symptoms have resolved - monitor at this time  2. SOB - will obtain echo to further evaluate.       Arnoldo Lenis, M.D.

## 2020-07-11 NOTE — Patient Instructions (Addendum)
Medication Instructions:   Your physician recommends that you continue on your current medications as directed. Please refer to the Current Medication list given to you today.  Labwork:  NONE  Testing/Procedures: Your physician has requested that you have an echocardiogram. Echocardiography is a painless test that uses sound waves to create images of your heart. It provides your doctor with information about the size and shape of your heart and how well your heart's chambers and valves are working. This procedure takes approximately one hour. There are no restrictions for this procedure.  Follow-Up:  Your physician recommends that you schedule a follow-up appointment in: pending.  Any Other Special Instructions Will Be Listed Below (If Applicable).  If you need a refill on your cardiac medications before your next appointment, please call your pharmacy. 

## 2020-07-12 ENCOUNTER — Ambulatory Visit (INDEPENDENT_AMBULATORY_CARE_PROVIDER_SITE_OTHER): Payer: Medicare Other

## 2020-07-12 DIAGNOSIS — R0602 Shortness of breath: Secondary | ICD-10-CM

## 2020-07-12 LAB — ECHOCARDIOGRAM COMPLETE
AR max vel: 2.44 cm2
AV Area VTI: 2.76 cm2
AV Area mean vel: 2.5 cm2
AV Mean grad: 1.8 mmHg
AV Peak grad: 3.5 mmHg
Ao pk vel: 0.93 m/s
Area-P 1/2: 3.83 cm2
Calc EF: 63.1 %
MV M vel: 2.25 m/s
MV Peak grad: 20.2 mmHg
S' Lateral: 3.01 cm
Single Plane A2C EF: 59.6 %
Single Plane A4C EF: 67.4 %

## 2020-07-13 ENCOUNTER — Encounter: Payer: Self-pay | Admitting: Sports Medicine

## 2020-07-13 ENCOUNTER — Ambulatory Visit: Payer: Medicare Other | Admitting: Sports Medicine

## 2020-07-13 ENCOUNTER — Other Ambulatory Visit: Payer: Self-pay

## 2020-07-13 VITALS — BP 112/74 | Ht 64.0 in | Wt 150.0 lb

## 2020-07-13 DIAGNOSIS — S76011A Strain of muscle, fascia and tendon of right hip, initial encounter: Secondary | ICD-10-CM

## 2020-07-13 DIAGNOSIS — M5416 Radiculopathy, lumbar region: Secondary | ICD-10-CM

## 2020-07-13 DIAGNOSIS — Z853 Personal history of malignant neoplasm of breast: Secondary | ICD-10-CM | POA: Diagnosis not present

## 2020-07-13 MED ORDER — IBUPROFEN 800 MG PO TABS: ORAL_TABLET | ORAL | 0 refills | Status: DC

## 2020-07-13 NOTE — Progress Notes (Signed)
PCP: Celene Squibb, MD  Subjective:   HPI: Patient is a 54 y.o. female with history of stage IV breast cancer, chronic low back pain, left gluteus minimus tear and left greater trochanteric bursitis in 2018, and patellar fracture with ORIF x 2 in 2015 who presents here for evaluation of right sided hip pain.  Her pain began sometime in April, she initially noticed it when she was walking upstairs to her camper and had pain in her groin along with her right lateral hip.  The pain radiates sometimes up her back and sometimes down the anterior aspect of her thigh but does not reach her knee.  The pain is fairly constant, but she does have worsening pain when she is walking upstairs, when she lays on either side at night, and when she crosses her legs.  She has been taking her chronic Percocet for chronic low back pain, and also has started taking ibuprofen which seems to be helping some.  She has trouble localizing the pain, sometimes it is in her groin, sometimes it is lateral hip, and sometimes it goes down the back of her leg or the front of her leg.  She denies any numbness or tingling in her groin, saddle anesthesia, urinary or bowel incontinence, fevers, chills, unexpected weight loss.  Review of Systems:  Per HPI.   [place], medications and smoking status reviewed.      Objective:  Physical Exam:  Gen: awake, alert, NAD, comfortable in exam room Pulm: breathing unlabored  Hip:  Inspection: No evidence of erythema, ecchymosis, swelling, edema  Palpation: Minimally tender to palpation over greater trochanteric bursa, nontender to ASIS, AIIS. No pain with pelvic compression.  ROM: Intact ROM to IR and ER to hip, equal and symmetric.  Strength: Intact to resisted hip flexion/extension.  4/5 strength with external rotation of the hip, possibly limited due to pain Special tests: Neg FABER. Neg Thomas test. Neg FADIR  Lumbar spine:  Inspection: No evidence of erythema, ecchymosis, swelling  edema.  Palpation: No midline spinal tenderness.  Positive mild paraspinal tenderness of the lumbar spines. Nontender to SI joints.  ROM: Intact to forward flexion, extension, rotation, and bending.  Some pain with extension     Assessment & Plan:  1.  Lumbar radiculopathy 2.  Suspected right gluteus medius tear  3.  History of metastatic breast cancer  Patient with fairly nonspecific symptoms, with low back/lateral hip/groin pain.  Is very difficult to distinguish whether this is an intra-articular pathology versus extra-articular versus lumbar pathology.  She does have some signs of lumbar radiculopathy with pain going down her leg, however she does also have some weakness with external rotation and mild pain laterally suggestive of possible gluteus tear.  Given that she also has known stage IV breast cancer, I am also concerned about metastases.  We will plan to obtain MRI lumbar spine and MRI of the right hip for further evaluation.  We will call patient with results and plan follow-up accordingly.  For now, continue NSAIDs, Tylenol, chronic Percocet for pain.   Dagoberto Ligas, MD [facility] Fellow 07/13/2020 1:19 PM   Patient seen and evaluated with the sports medicine fellow.  I agree with the above plan of care.  Patient has a known history of stage IV breast cancer.  She would like to get MRIs of her lumbar spine and right hip to rule out metastatic disease.  I think that is reasonable.  Phone follow-up with those results when available.  We will delineate further treatment based on those findings.

## 2020-07-21 ENCOUNTER — Telehealth: Payer: Self-pay | Admitting: *Deleted

## 2020-07-21 NOTE — Telephone Encounter (Signed)
Pt voiced understanding - requested to see Dr Harl Bowie vs PA or extender - 3 month f/u scheduled

## 2020-07-21 NOTE — Telephone Encounter (Signed)
-----   Message from Arnoldo Lenis, MD sent at 07/21/2020  1:27 PM EDT ----- Echo overall looks good other than some mild age related stiffness of the heart muscle, this is common and can cause some swelling at times. No worrisome findings on echo. F/u 3 months reevaluate symptoms with PA   Zandra Abts MD

## 2020-07-27 ENCOUNTER — Other Ambulatory Visit: Payer: Medicare Other

## 2020-08-03 ENCOUNTER — Encounter: Payer: Self-pay | Admitting: Genetic Counselor

## 2020-08-14 ENCOUNTER — Ambulatory Visit
Admission: RE | Admit: 2020-08-14 | Discharge: 2020-08-14 | Disposition: A | Discharge status: Home / Self Care | Payer: Medicare Other | Source: Ambulatory Visit | Attending: Sports Medicine | Admitting: Sports Medicine

## 2020-08-14 ENCOUNTER — Other Ambulatory Visit: Payer: Self-pay

## 2020-08-14 DIAGNOSIS — S76011A Strain of muscle, fascia and tendon of right hip, initial encounter: Secondary | ICD-10-CM

## 2020-08-14 DIAGNOSIS — M25551 Pain in right hip: Secondary | ICD-10-CM | POA: Diagnosis not present

## 2020-08-14 DIAGNOSIS — M48061 Spinal stenosis, lumbar region without neurogenic claudication: Secondary | ICD-10-CM | POA: Diagnosis not present

## 2020-08-14 DIAGNOSIS — M5416 Radiculopathy, lumbar region: Secondary | ICD-10-CM

## 2020-08-17 ENCOUNTER — Telehealth: Payer: Self-pay | Admitting: Sports Medicine

## 2020-08-17 NOTE — Telephone Encounter (Signed)
  I spoke with [name] on the phone today after reviewing MRI findings of her right hip and her lumbar spine.  MRI of the right hip shows a partial insertional tear of the gluteus minimus tendon.  MRI of her lumbar spine shows L4-L5 and L5-S1 degenerative disc disease and facet arthropathy.  She has seen Dr. Ninfa Linden in the past for left hip pain.  I recommended that she see him again for her right hip.  In regards to her low back pain, she is asking about trying an inversion table.  I think she can try it but I also think physical therapy would be beneficial.  She will think this over.  Follow-up with me as needed.

## 2020-08-23 DIAGNOSIS — E782 Mixed hyperlipidemia: Secondary | ICD-10-CM | POA: Diagnosis not present

## 2020-08-23 DIAGNOSIS — R05 Cough: Secondary | ICD-10-CM | POA: Diagnosis not present

## 2020-08-23 DIAGNOSIS — G894 Chronic pain syndrome: Secondary | ICD-10-CM | POA: Diagnosis not present

## 2020-08-23 DIAGNOSIS — J029 Acute pharyngitis, unspecified: Secondary | ICD-10-CM | POA: Diagnosis not present

## 2020-11-01 DIAGNOSIS — Z85828 Personal history of other malignant neoplasm of skin: Secondary | ICD-10-CM | POA: Diagnosis not present

## 2020-11-01 DIAGNOSIS — L570 Actinic keratosis: Secondary | ICD-10-CM | POA: Diagnosis not present

## 2020-11-01 DIAGNOSIS — C44629 Squamous cell carcinoma of skin of left upper limb, including shoulder: Secondary | ICD-10-CM | POA: Diagnosis not present

## 2020-11-01 DIAGNOSIS — L718 Other rosacea: Secondary | ICD-10-CM | POA: Diagnosis not present

## 2020-11-01 DIAGNOSIS — L723 Sebaceous cyst: Secondary | ICD-10-CM | POA: Diagnosis not present

## 2020-11-15 ENCOUNTER — Ambulatory Visit (INDEPENDENT_AMBULATORY_CARE_PROVIDER_SITE_OTHER): Payer: Medicare Other | Admitting: Cardiology

## 2020-11-15 ENCOUNTER — Encounter: Payer: Self-pay | Admitting: Cardiology

## 2020-11-15 VITALS — BP 122/74 | HR 79 | Ht 64.5 in | Wt 154.0 lb

## 2020-11-15 DIAGNOSIS — R0789 Other chest pain: Secondary | ICD-10-CM | POA: Diagnosis not present

## 2020-11-15 DIAGNOSIS — R0602 Shortness of breath: Secondary | ICD-10-CM

## 2020-11-15 NOTE — Patient Instructions (Signed)
Your physician wants you to follow-up in: 6 MONTHS WITH DR BRANCH   Your physician recommends that you continue on your current medications as directed. Please refer to the Current Medication list given to you today.  Thank you for choosing [facility]!!   

## 2020-11-15 NOTE — Progress Notes (Signed)
Clinical Summary [name] is a 54 y.o.female seen today as a new patient for the following medical problems.    1. Chest pain - episodes several weeks ago - sharp pain left chest into left upper back. 8/10 in severity. No other associated. Worst with deep breathing. Pain lasted a few minutes, about 20 minutes. Had a similar episode few days later. - symptoms have resolved  CAD risk factors: HTN, hyperlipidemia, former smoke x 25 years - no recent symptoms   2. SOB - ongoing 3-6 months - can occur at rest or exertion - sporadic episodes - sedentary lifestyle due to chronic joint pains.  - can have some LE edema at times, she is on lasix daily. If missing doses significant weight gain - some coughing mainly in the morning.    - history of breast CA, prior radiation and chemo   06/2020 echo: LVEF 60-65%, no WMAs, grade I DDx, normal RV, normal PASP.  - sedentary lifestyle due to chronic joint pains - no chest pains.  - former smoker x 10+ years. Occasional coughing - reports no recent exertional SOB. Occasional feeling of not getting adequate breath, will take a few deep breaths.      2. Hyperlipidemia - turned down medical therapy   SH: has not had covid vaccine.  Past Medical History:  Diagnosis Date  . Breast cancer, stage 3 ([place]) 10/18/2011  . Breast cancer, stage 4 ([place]) dx'd 03/12/11   left  . Cancer ([place])    basal cell - skin  . Hypertension   . Personal history of chemotherapy   . Personal history of radiation therapy   . Radiation adverse effect 12/02/2011     Allergies  Allergen Reactions  . Bactrim Diarrhea and Nausea And Vomiting  . Dilaudid [Hydromorphone Hcl] Rash  . Latex Itching and Rash     Current Outpatient Medications  Medication Sig Dispense Refill  . ALPRAZolam (XANAX) 1 MG tablet Take 1 tablet (1 mg total) by mouth 2 (two) times daily as needed for anxiety or sleep. 90 tablet 0  . atenolol (TENORMIN) 100 MG  tablet Take 100 mg by mouth daily.  3  . furosemide (LASIX) 20 MG tablet 20 mg daily.   2  . ibuprofen (ADVIL) 800 MG tablet Take one pill twice a day with food as needed for pain 30 tablet 0  . ibuprofen (ADVIL,MOTRIN) 200 MG tablet Take 400 mg by mouth daily as needed for pain.    Marland Kitchen oxyCODONE (OXY IR/ROXICODONE) 5 MG immediate release tablet Take 5 mg by mouth 3 (three) times daily as needed.    . triamterene-hydrochlorothiazide (DYAZIDE) 37.5-25 MG per capsule Take 1 capsule by mouth every morning.       No current facility-administered medications for this visit.     Past Surgical History:  Procedure Laterality Date  . AXILLARY LYMPH NODE DISSECTION    . BREAST BIOPSY    . DILATION AND CURETTAGE OF UTERUS    . LIPOMA EXCISION  5 years   upper back  . LYMPH NODE DISSECTION    . MASTECTOMY    . MASTECTOMY PARTIAL / LUMPECTOMY    . PORTACATH PLACEMENT    . portacath removal       Allergies  Allergen Reactions  . Bactrim Diarrhea and Nausea And Vomiting  . Dilaudid [Hydromorphone Hcl] Rash  . Latex Itching and Rash      Family History  Problem Relation Age of Onset  . Breast  cancer Maternal Grandmother   . Lung cancer Paternal Grandfather   . Breast cancer Paternal Grandmother      Social History [name] reports that she has quit smoking. She has never used smokeless tobacco. [name] reports current alcohol use of about 1.0 - 3.0 standard drink of alcohol per week.   Review of Systems CONSTITUTIONAL: No weight loss, fever, chills, weakness or fatigue.  HEENT: Eyes: No visual loss, blurred vision, double vision or yellow sclerae.No hearing loss, sneezing, congestion, runny nose or sore throat.  SKIN: No rash or itching.  CARDIOVASCULAR: per hpi RESPIRATORY: per hpi  GASTROINTESTINAL: No anorexia, nausea, vomiting or diarrhea. No abdominal pain or blood.  GENITOURINARY: No burning on urination, no polyuria NEUROLOGICAL: No headache, dizziness,  syncope, paralysis, ataxia, numbness or tingling in the extremities. No change in bowel or bladder control.  MUSCULOSKELETAL: No muscle, back pain, joint pain or stiffness.  LYMPHATICS: No enlarged nodes. No history of splenectomy.  PSYCHIATRIC: No history of depression or anxiety.  ENDOCRINOLOGIC: No reports of sweating, cold or heat intolerance. No polyuria or polydipsia.  Marland Kitchen   Physical Examination Today's Vitals   11/15/20 1008  BP: 122/74  Pulse: 79  SpO2: 99%  Weight: 154 lb (69.9 kg)  Height: 5' 4.5" (1.638 m)   Body mass index is 26.03 kg/m.  Gen: resting comfortably, no acute distress HEENT: no scleral icterus, pupils equal round and reactive, no palptable cervical adenopathy,  CV: RRR, no m/r/g, no jvd Resp: Clear to auscultation bilaterally GI: abdomen is soft, non-tender, non-distended, normal bowel sounds, no hepatosplenomegaly MSK: extremities are warm, no edema.  Skin: warm, no rash Neuro:  no focal deficits Psych: appropriate affect   Diagnostic Studies  06/2020 echo IMPRESSIONS    1. Left ventricular ejection fraction, by estimation, is 60 to 65%. The  left ventricle has normal function. The left ventricle has no regional  wall motion abnormalities. Left ventricular diastolic parameters are  consistent with Grade I diastolic  dysfunction (impaired relaxation).  2. Right ventricular systolic function is normal. The right ventricular  size is normal. There is normal pulmonary artery systolic pressure.  3. The mitral valve is normal in structure. Trivial mitral valve  regurgitation. No evidence of mitral stenosis.  4. The aortic valve is normal in structure. Aortic valve regurgitation is  not visualized. No aortic stenosis is present.    Assessment and Plan  1. Chest pain - not consistent with cardiac chest pain - no recurrence, continue to monitor  2. SOB - fairly benign echo other than mild diastolic dysfunction, fluid status is controlled on  diuretic - no significant recent exertional symptoms, just occasional feeling of not having adequate breath, takes a few deep breaths to help. Nonspecific symptoms, would just monitor at this time, if progression would plan PFTs       Arnoldo Lenis, M.D

## 2020-11-22 DIAGNOSIS — J029 Acute pharyngitis, unspecified: Secondary | ICD-10-CM | POA: Diagnosis not present

## 2020-11-22 DIAGNOSIS — R7301 Impaired fasting glucose: Secondary | ICD-10-CM | POA: Diagnosis not present

## 2020-11-22 DIAGNOSIS — I10 Essential (primary) hypertension: Secondary | ICD-10-CM | POA: Diagnosis not present

## 2020-11-22 DIAGNOSIS — E782 Mixed hyperlipidemia: Secondary | ICD-10-CM | POA: Diagnosis not present

## 2020-11-22 DIAGNOSIS — Z712 Person consulting for explanation of examination or test findings: Secondary | ICD-10-CM | POA: Diagnosis not present

## 2020-11-29 DIAGNOSIS — R944 Abnormal results of kidney function studies: Secondary | ICD-10-CM | POA: Diagnosis not present

## 2020-11-29 DIAGNOSIS — E782 Mixed hyperlipidemia: Secondary | ICD-10-CM | POA: Diagnosis not present

## 2020-11-29 DIAGNOSIS — R945 Abnormal results of liver function studies: Secondary | ICD-10-CM | POA: Diagnosis not present

## 2021-03-05 DIAGNOSIS — R0602 Shortness of breath: Secondary | ICD-10-CM | POA: Diagnosis not present

## 2021-03-05 DIAGNOSIS — E876 Hypokalemia: Secondary | ICD-10-CM | POA: Diagnosis not present

## 2021-03-05 DIAGNOSIS — E782 Mixed hyperlipidemia: Secondary | ICD-10-CM | POA: Diagnosis not present

## 2021-03-05 DIAGNOSIS — Z853 Personal history of malignant neoplasm of breast: Secondary | ICD-10-CM | POA: Diagnosis not present

## 2021-03-14 DIAGNOSIS — E782 Mixed hyperlipidemia: Secondary | ICD-10-CM | POA: Diagnosis not present

## 2021-03-14 DIAGNOSIS — S76312A Strain of muscle, fascia and tendon of the posterior muscle group at thigh level, left thigh, initial encounter: Secondary | ICD-10-CM | POA: Diagnosis not present

## 2021-03-14 DIAGNOSIS — M4696 Unspecified inflammatory spondylopathy, lumbar region: Secondary | ICD-10-CM | POA: Diagnosis not present

## 2021-03-14 DIAGNOSIS — G894 Chronic pain syndrome: Secondary | ICD-10-CM | POA: Diagnosis not present

## 2021-03-14 DIAGNOSIS — R0602 Shortness of breath: Secondary | ICD-10-CM | POA: Diagnosis not present

## 2021-03-14 DIAGNOSIS — R944 Abnormal results of kidney function studies: Secondary | ICD-10-CM | POA: Diagnosis not present

## 2021-03-14 DIAGNOSIS — R945 Abnormal results of liver function studies: Secondary | ICD-10-CM | POA: Diagnosis not present

## 2021-03-14 DIAGNOSIS — R7301 Impaired fasting glucose: Secondary | ICD-10-CM | POA: Diagnosis not present

## 2021-03-14 DIAGNOSIS — E876 Hypokalemia: Secondary | ICD-10-CM | POA: Diagnosis not present

## 2021-04-18 DIAGNOSIS — L570 Actinic keratosis: Secondary | ICD-10-CM | POA: Diagnosis not present

## 2021-04-18 DIAGNOSIS — L853 Xerosis cutis: Secondary | ICD-10-CM | POA: Diagnosis not present

## 2021-04-18 DIAGNOSIS — L738 Other specified follicular disorders: Secondary | ICD-10-CM | POA: Diagnosis not present

## 2021-04-18 DIAGNOSIS — L718 Other rosacea: Secondary | ICD-10-CM | POA: Diagnosis not present

## 2021-04-18 DIAGNOSIS — Z85828 Personal history of other malignant neoplasm of skin: Secondary | ICD-10-CM | POA: Diagnosis not present

## 2021-05-07 ENCOUNTER — Other Ambulatory Visit: Payer: Self-pay

## 2021-05-07 ENCOUNTER — Telehealth: Payer: Self-pay | Admitting: Sports Medicine

## 2021-05-07 DIAGNOSIS — G8929 Other chronic pain: Secondary | ICD-10-CM

## 2021-05-07 NOTE — Telephone Encounter (Signed)
  I spoke with [name] on the phone today after she emailed last week with concerns about ankylosing spondylitis.  She states that her mother was diagnosed with this in her late 33s and early 67s and she is concerned that this may be the reason for her chronic back and hip pain.  Although her x-rays and MRI do not show classic findings for ankylosing spondylitis, she would like to proceed with an HLA-B27 to rule this out.  Regardless of those findings, I recommended formal physical therapy.  She is in agreement with that.  We will arrange for that to be done in [place] and I will call her with her HLA-B27 results when available.

## 2021-05-07 NOTE — Telephone Encounter (Signed)
Opened by mistake.

## 2021-06-04 ENCOUNTER — Ambulatory Visit: Payer: Medicare Other | Admitting: Cardiology

## 2021-06-04 NOTE — Progress Notes (Deleted)
Clinical Summary [name] is a 55 y.o.femaleseen today as a new patient for the following medical problems.      1. Chest pain - episodes several weeks ago - sharp pain left chest into left upper back. 8/10 in severity. No other associated. Worst with deep breathing. Pain lasted a few minutes, about 20 minutes. Had a similar episode few days later. - symptoms have resolved   CAD risk factors: HTN, hyperlipidemia, former smoke x 25 years - no recent symptoms     2. SOB - ongoing 3-6 months - can occur at rest or exertion - sporadic episodes - sedentary lifestyle due to chronic joint pains. - can have some LE edema at times, she is on lasix daily. If missing doses significant weight gain - some coughing mainly in the morning.      - history of breast CA, prior radiation and chemo     06/2020 echo: LVEF 60-65%, no WMAs, grade I DDx, normal RV, normal PASP.   - sedentary lifestyle due to chronic joint pains - no chest pains. - former smoker x 10+ years. Occasional coughing - reports no recent exertional SOB. Occasional feeling of not getting adequate breath, will take a few deep breaths.          2. Hyperlipidemia - turned down medical therapy     SH: has not had covid vaccine.    Past Medical History:  Diagnosis Date   Breast cancer, stage 3 ([place]) 10/18/2011   Breast cancer, stage 4 ([place]) dx'd 03/12/11   left   Cancer ([place])    basal cell - skin   Hypertension    Personal history of chemotherapy    Personal history of radiation therapy    Radiation adverse effect 12/02/2011     Allergies  Allergen Reactions   Bactrim Diarrhea and Nausea And Vomiting   Dilaudid [Hydromorphone Hcl] Rash   Latex Itching and Rash     Current Outpatient Medications  Medication Sig Dispense Refill   ALPRAZolam (XANAX) 1 MG tablet Take 1 tablet (1 mg total) by mouth 2 (two) times daily as needed for anxiety or sleep. 90 tablet 0   atenolol (TENORMIN) 100 MG tablet  Take 100 mg by mouth daily.  3   furosemide (LASIX) 20 MG tablet 20 mg daily.   2   ibuprofen (ADVIL) 800 MG tablet Take one pill twice a day with food as needed for pain 30 tablet 0   ibuprofen (ADVIL,MOTRIN) 200 MG tablet Take 400 mg by mouth daily as needed for pain.     oxyCODONE (OXY IR/ROXICODONE) 5 MG immediate release tablet Take 5 mg by mouth 3 (three) times daily as needed.     triamterene-hydrochlorothiazide (DYAZIDE) 37.5-25 MG per capsule Take 1 capsule by mouth every morning.       No current facility-administered medications for this visit.     Past Surgical History:  Procedure Laterality Date   AXILLARY LYMPH NODE DISSECTION     BREAST BIOPSY     DILATION AND CURETTAGE OF UTERUS     LIPOMA EXCISION  5 years   upper back   LYMPH NODE DISSECTION     MASTECTOMY     MASTECTOMY PARTIAL / LUMPECTOMY     PORTACATH PLACEMENT     portacath removal       Allergies  Allergen Reactions   Bactrim Diarrhea and Nausea And Vomiting   Dilaudid [Hydromorphone Hcl] Rash   Latex Itching and Rash  Family History  Problem Relation Age of Onset   Breast cancer Maternal Grandmother    Lung cancer Paternal Grandfather    Breast cancer Paternal Grandmother      Social History [name] reports that she has quit smoking. She has never used smokeless tobacco. [name] reports current alcohol use of about 1.0 - 3.0 standard drink of alcohol per week.   Review of Systems CONSTITUTIONAL: No weight loss, fever, chills, weakness or fatigue.  HEENT: Eyes: No visual loss, blurred vision, double vision or yellow sclerae.No hearing loss, sneezing, congestion, runny nose or sore throat.  SKIN: No rash or itching.  CARDIOVASCULAR:  RESPIRATORY: No shortness of breath, cough or sputum.  GASTROINTESTINAL: No anorexia, nausea, vomiting or diarrhea. No abdominal pain or blood.  GENITOURINARY: No burning on urination, no polyuria NEUROLOGICAL: No headache, dizziness, syncope,  paralysis, ataxia, numbness or tingling in the extremities. No change in bowel or bladder control.  MUSCULOSKELETAL: No muscle, back pain, joint pain or stiffness.  LYMPHATICS: No enlarged nodes. No history of splenectomy.  PSYCHIATRIC: No history of depression or anxiety.  ENDOCRINOLOGIC: No reports of sweating, cold or heat intolerance. No polyuria or polydipsia.  Marland Kitchen   Physical Examination There were no vitals filed for this visit. There were no vitals filed for this visit.  Gen: resting comfortably, no acute distress HEENT: no scleral icterus, pupils equal round and reactive, no palptable cervical adenopathy,  CV Resp: Clear to auscultation bilaterally GI: abdomen is soft, non-tender, non-distended, normal bowel sounds, no hepatosplenomegaly MSK: extremities are warm, no edema.  Skin: warm, no rash Neuro:  no focal deficits Psych: appropriate affect   Diagnostic Studies  06/2020 echo IMPRESSIONS     1. Left ventricular ejection fraction, by estimation, is 60 to 65%. The  left ventricle has normal function. The left ventricle has no regional  wall motion abnormalities. Left ventricular diastolic parameters are  consistent with Grade I diastolic  dysfunction (impaired relaxation).   2. Right ventricular systolic function is normal. The right ventricular  size is normal. There is normal pulmonary artery systolic pressure.   3. The mitral valve is normal in structure. Trivial mitral valve  regurgitation. No evidence of mitral stenosis.   4. The aortic valve is normal in structure. Aortic valve regurgitation is  not visualized. No aortic stenosis is present.   Assessment and Plan  1. Chest pain - not consistent with cardiac chest pain - no recurrence, continue to monitor   2. SOB - fairly benign echo other than mild diastolic dysfunction, fluid status is controlled on diuretic - no significant recent exertional symptoms, just occasional feeling of not having adequate  breath, takes a few deep breaths to help. Nonspecific symptoms, would just monitor at this time, if progression would plan PFTs      Arnoldo Lenis, M.D., F.A.C.C.

## 2021-07-10 DIAGNOSIS — M545 Low back pain, unspecified: Secondary | ICD-10-CM | POA: Diagnosis not present

## 2021-07-10 DIAGNOSIS — R7301 Impaired fasting glucose: Secondary | ICD-10-CM | POA: Diagnosis not present

## 2021-07-10 DIAGNOSIS — E782 Mixed hyperlipidemia: Secondary | ICD-10-CM | POA: Diagnosis not present

## 2021-07-10 DIAGNOSIS — G8929 Other chronic pain: Secondary | ICD-10-CM | POA: Diagnosis not present

## 2021-07-17 DIAGNOSIS — M25562 Pain in left knee: Secondary | ICD-10-CM | POA: Diagnosis not present

## 2021-07-17 DIAGNOSIS — Z853 Personal history of malignant neoplasm of breast: Secondary | ICD-10-CM | POA: Diagnosis not present

## 2021-07-17 DIAGNOSIS — R945 Abnormal results of liver function studies: Secondary | ICD-10-CM | POA: Diagnosis not present

## 2021-07-17 DIAGNOSIS — M5416 Radiculopathy, lumbar region: Secondary | ICD-10-CM | POA: Diagnosis not present

## 2021-07-17 DIAGNOSIS — E782 Mixed hyperlipidemia: Secondary | ICD-10-CM | POA: Diagnosis not present

## 2021-07-17 DIAGNOSIS — R7301 Impaired fasting glucose: Secondary | ICD-10-CM | POA: Diagnosis not present

## 2021-07-17 DIAGNOSIS — E876 Hypokalemia: Secondary | ICD-10-CM | POA: Diagnosis not present

## 2021-07-17 DIAGNOSIS — R944 Abnormal results of kidney function studies: Secondary | ICD-10-CM | POA: Diagnosis not present

## 2021-07-17 DIAGNOSIS — T8484XS Pain due to internal orthopedic prosthetic devices, implants and grafts, sequela: Secondary | ICD-10-CM | POA: Diagnosis not present

## 2021-10-02 ENCOUNTER — Encounter (INDEPENDENT_AMBULATORY_CARE_PROVIDER_SITE_OTHER): Payer: Self-pay | Admitting: *Deleted

## 2021-10-12 DIAGNOSIS — R1031 Right lower quadrant pain: Secondary | ICD-10-CM | POA: Diagnosis not present

## 2021-11-14 DIAGNOSIS — R109 Unspecified abdominal pain: Secondary | ICD-10-CM | POA: Diagnosis not present

## 2021-11-14 DIAGNOSIS — R590 Localized enlarged lymph nodes: Secondary | ICD-10-CM | POA: Diagnosis not present

## 2021-11-22 ENCOUNTER — Telehealth (INDEPENDENT_AMBULATORY_CARE_PROVIDER_SITE_OTHER): Payer: Self-pay | Admitting: Internal Medicine

## 2021-11-22 NOTE — Telephone Encounter (Signed)
Sabrina from Dr Juel Burrow office would like to know the status of patients colonoscopy - please advise - ph# [phone]

## 2021-11-27 ENCOUNTER — Other Ambulatory Visit (INDEPENDENT_AMBULATORY_CARE_PROVIDER_SITE_OTHER): Payer: Self-pay

## 2021-11-27 ENCOUNTER — Telehealth (INDEPENDENT_AMBULATORY_CARE_PROVIDER_SITE_OTHER): Payer: Self-pay

## 2021-11-27 DIAGNOSIS — Z1211 Encounter for screening for malignant neoplasm of colon: Secondary | ICD-10-CM

## 2021-11-27 NOTE — Telephone Encounter (Signed)
Referring MD/PCP: Nevada Crane  Procedure: Tcs   Reason/Indication:  Screening   Has patient had this procedure before?  no  If so, when, by whom and where?    Is there a family history of colon cancer?  no  Who?  What age when diagnosed?    Is patient diabetic? If yes, Type 1 or Type 2   no      Does patient have prosthetic heart valve or mechanical valve?  no  Do you have a pacemaker/defibrillator?  no  Has patient ever had endocarditis/atrial fibrillation? no  Does patient use oxygen? no  Has patient had joint replacement within last 12 months?  no  Is patient constipated or do they take laxatives? no  Does patient have a history of alcohol/drug use?  no  Have you had a stroke/heart attack last 6 mths? no  Do you take medicine for weight loss?  no  For female patients,: do you still have your menstrual cycle? no  Is patient on blood thinner such as Coumadin, Plavix and/or Aspirin? no  Medications: atenolol 100 mg daily, triamterene 37.5 mg 12.5 mg daily, Lasix 10 mg daily, Xanax 1 mg prn   Allergies: latex, bactrim  Medication Adjustment per Dr Laural Golden none  Procedure date & time: Wednesday 12/26/21 at 9:45

## 2021-11-29 ENCOUNTER — Encounter (INDEPENDENT_AMBULATORY_CARE_PROVIDER_SITE_OTHER): Payer: Self-pay

## 2021-11-29 ENCOUNTER — Telehealth (INDEPENDENT_AMBULATORY_CARE_PROVIDER_SITE_OTHER): Payer: Self-pay

## 2021-11-29 MED ORDER — PEG 3350-KCL-NA BICARB-NACL 420 G PO SOLR: 4000.0000 mL | ORAL | 0 refills | Status: DC

## 2021-11-29 NOTE — Telephone Encounter (Signed)
[name], CMA  

## 2021-12-18 ENCOUNTER — Other Ambulatory Visit (INDEPENDENT_AMBULATORY_CARE_PROVIDER_SITE_OTHER): Payer: Self-pay

## 2021-12-20 NOTE — Patient Instructions (Signed)
[name]  12/20/2021     @PREFPERIOPPHARMACY @   Your procedure is scheduled on  12/26/2021.   Report to [facility] at  [place]  A.M.   Call this number if you have problems the morning of surgery:  [phone]   Remember:  Follow the diet and prep instructions given to you by the office.    Take these medicines the morning of surgery with A SIP OF WATER                xanax(if needed), atenolol, oxycodone(if needed).     Do not wear jewelry, make-up or nail polish.  Do not wear lotions, powders, or perfumes, or deodorant.  Do not shave 48 hours prior to surgery.  Men may shave face and neck.  Do not bring valuables to the hospital.  [facility] is not responsible for any belongings or valuables.  Contacts, dentures or bridgework may not be worn into surgery.  Leave your suitcase in the car.  After surgery it may be brought to your room.  For patients admitted to the hospital, discharge time will be determined by your treatment team.  Patients discharged the day of surgery will not be allowed to drive home and must have someone with them for 24 hours.    Special instructions:   DO NOT smoke tobacco or vape for 24 hours before your procedure.  Please read over the following fact sheets that you were given. Anesthesia Post-op Instructions and Care and Recovery After Surgery      Colonoscopy, Adult, Care After This sheet gives you information about how to care for yourself after your procedure. Your health care provider may also give you more specific instructions. If you have problems or questions, contact your health care provider. What can I expect after the procedure? After the procedure, it is common to have: A small amount of blood in your stool for 24 hours after the procedure. Some gas. Mild cramping or bloating of your abdomen. Follow these instructions at home: Eating and drinking  Drink enough fluid to keep your urine pale yellow. Follow  instructions from your health care provider about eating or drinking restrictions. Resume your normal diet as instructed by your health care provider. Avoid heavy or fried foods that are hard to digest. Activity Rest as told by your health care provider. Avoid sitting for a long time without moving. Get up to take short walks every 1-2 hours. This is important to improve blood flow and breathing. Ask for help if you feel weak or unsteady. Return to your normal activities as told by your health care provider. Ask your health care provider what activities are safe for you. Managing cramping and bloating  Try walking around when you have cramps or feel bloated. Apply heat to your abdomen as told by your health care provider. Use the heat source that your health care provider recommends, such as a moist heat pack or a heating pad. Place a towel between your skin and the heat source. Leave the heat on for 20-30 minutes. Remove the heat if your skin turns bright red. This is especially important if you are unable to feel pain, heat, or cold. You may have a greater risk of getting burned. General instructions If you were given a sedative during the procedure, it can affect you for several hours. Do not drive or operate machinery until your health care provider says that it is safe. For the  first 24 hours after the procedure: Do not sign important documents. Do not drink alcohol. Do your regular daily activities at a slower pace than normal. Eat soft foods that are easy to digest. Take over-the-counter and prescription medicines only as told by your health care provider. Keep all follow-up visits as told by your health care provider. This is important. Contact a health care provider if: You have blood in your stool 2-3 days after the procedure. Get help right away if you have: More than a small spotting of blood in your stool. Large blood clots in your stool. Swelling of your abdomen. Nausea or  vomiting. A fever. Increasing pain in your abdomen that is not relieved with medicine. Summary After the procedure, it is common to have a small amount of blood in your stool. You may also have mild cramping and bloating of your abdomen. If you were given a sedative during the procedure, it can affect you for several hours. Do not drive or operate machinery until your health care provider says that it is safe. Get help right away if you have a lot of blood in your stool, nausea or vomiting, a fever, or increased pain in your abdomen. This information is not intended to replace advice given to you by your health care provider. Make sure you discuss any questions you have with your health care provider. Document Revised: 10/08/2019 Document Reviewed: 06/28/2019 Elsevier Patient Education  [place] After This sheet gives you information about how to care for yourself after your procedure. Your health care provider may also give you more specific instructions. If you have problems or questions, contact your health care provider. What can I expect after the procedure? After the procedure, it is common to have: Tiredness. Forgetfulness about what happened after the procedure. Impaired judgment for important decisions. Nausea or vomiting. Some difficulty with balance. Follow these instructions at home: For the time period you were told by your health care provider:   Rest as needed. Do not participate in activities where you could fall or become injured. Do not drive or use machinery. Do not drink alcohol. Do not take sleeping pills or medicines that cause drowsiness. Do not make important decisions or sign legal documents. Do not take care of children on your own. Eating and drinking Follow the diet that is recommended by your health care provider. Drink enough fluid to keep your urine pale yellow. If you vomit: Drink water, juice, or soup when  you can drink without vomiting. Make sure you have little or no nausea before eating solid foods. General instructions Have a responsible adult stay with you for the time you are told. It is important to have someone help care for you until you are awake and alert. Take over-the-counter and prescription medicines only as told by your health care provider. If you have sleep apnea, surgery and certain medicines can increase your risk for breathing problems. Follow instructions from your health care provider about wearing your sleep device: Anytime you are sleeping, including during daytime naps. While taking prescription pain medicines, sleeping medicines, or medicines that make you drowsy. Avoid smoking. Keep all follow-up visits as told by your health care provider. This is important. Contact a health care provider if: You keep feeling nauseous or you keep vomiting. You feel light-headed. You are still sleepy or having trouble with balance after 24 hours. You develop a rash. You have a fever. You have redness or swelling around the  IV site. Get help right away if: You have trouble breathing. You have new-onset confusion at home. Summary For several hours after your procedure, you may feel tired. You may also be forgetful and have poor judgment. Have a responsible adult stay with you for the time you are told. It is important to have someone help care for you until you are awake and alert. Rest as told. Do not drive or operate machinery. Do not drink alcohol or take sleeping pills. Get help right away if you have trouble breathing, or if you suddenly become confused. This information is not intended to replace advice given to you by your health care provider. Make sure you discuss any questions you have with your health care provider. Document Revised: 08/17/2020 Document Reviewed: 11/04/2019 Elsevier Patient Education  2022 [employer].

## 2021-12-24 ENCOUNTER — Encounter (HOSPITAL_COMMUNITY): Payer: Self-pay

## 2021-12-24 ENCOUNTER — Other Ambulatory Visit: Payer: Self-pay

## 2021-12-24 ENCOUNTER — Encounter (HOSPITAL_COMMUNITY)
Admission: RE | Admit: 2021-12-24 | Discharge: 2021-12-24 | Disposition: A | Discharge status: Home / Self Care | Payer: Medicare Other | Source: Ambulatory Visit | Attending: Internal Medicine | Admitting: Internal Medicine

## 2021-12-24 DIAGNOSIS — Z01818 Encounter for other preprocedural examination: Secondary | ICD-10-CM | POA: Insufficient documentation

## 2021-12-24 DIAGNOSIS — Z1211 Encounter for screening for malignant neoplasm of colon: Secondary | ICD-10-CM

## 2021-12-24 LAB — BASIC METABOLIC PANEL
Anion gap: 13 (ref 5–15)
BUN: 21 mg/dL — ABNORMAL HIGH (ref 6–20)
CO2: 32 mmol/L (ref 22–32)
Calcium: 9.7 mg/dL (ref 8.9–10.3)
Chloride: 91 mmol/L — ABNORMAL LOW (ref 98–111)
Creatinine, Ser: 1.03 mg/dL — ABNORMAL HIGH (ref 0.44–1.00)
GFR, Estimated: >60 mL/min (ref >60)
Glucose, Bld: 148 mg/dL — ABNORMAL HIGH (ref 70–99)
Potassium: 3 mmol/L — ABNORMAL LOW (ref 3.5–5.1)
Sodium: 136 mmol/L (ref 135–145)

## 2021-12-25 ENCOUNTER — Other Ambulatory Visit (INDEPENDENT_AMBULATORY_CARE_PROVIDER_SITE_OTHER): Payer: Self-pay | Admitting: Internal Medicine

## 2021-12-25 MED ORDER — POTASSIUM CHLORIDE CRYS ER 20 MEQ PO TBCR: 20.0000 meq | EXTENDED_RELEASE_TABLET | Freq: Every day | ORAL | 0 refills | Status: DC

## 2021-12-26 ENCOUNTER — Encounter (INDEPENDENT_AMBULATORY_CARE_PROVIDER_SITE_OTHER): Payer: Self-pay | Admitting: *Deleted

## 2021-12-26 ENCOUNTER — Other Ambulatory Visit: Payer: Self-pay

## 2021-12-26 ENCOUNTER — Ambulatory Visit (HOSPITAL_COMMUNITY): Payer: Medicare Other | Admitting: Anesthesiology

## 2021-12-26 ENCOUNTER — Encounter (HOSPITAL_COMMUNITY)
Admission: RE | Disposition: A | Discharge status: Home / Self Care | Payer: Self-pay | Source: Home / Self Care | Attending: Internal Medicine

## 2021-12-26 ENCOUNTER — Encounter (HOSPITAL_COMMUNITY): Payer: Self-pay | Admitting: Internal Medicine

## 2021-12-26 ENCOUNTER — Ambulatory Visit (HOSPITAL_COMMUNITY)
Admission: RE | Admit: 2021-12-26 | Discharge: 2021-12-26 | Disposition: A | Discharge status: Home / Self Care | Payer: Medicare Other | Attending: Internal Medicine | Admitting: Internal Medicine

## 2021-12-26 DIAGNOSIS — Z853 Personal history of malignant neoplasm of breast: Secondary | ICD-10-CM | POA: Insufficient documentation

## 2021-12-26 DIAGNOSIS — I10 Essential (primary) hypertension: Secondary | ICD-10-CM | POA: Insufficient documentation

## 2021-12-26 DIAGNOSIS — Z1211 Encounter for screening for malignant neoplasm of colon: Secondary | ICD-10-CM | POA: Insufficient documentation

## 2021-12-26 DIAGNOSIS — Z79899 Other long term (current) drug therapy: Secondary | ICD-10-CM | POA: Diagnosis not present

## 2021-12-26 DIAGNOSIS — Z87891 Personal history of nicotine dependence: Secondary | ICD-10-CM | POA: Insufficient documentation

## 2021-12-26 DIAGNOSIS — K648 Other hemorrhoids: Secondary | ICD-10-CM | POA: Diagnosis not present

## 2021-12-26 DIAGNOSIS — K644 Residual hemorrhoidal skin tags: Secondary | ICD-10-CM | POA: Diagnosis not present

## 2021-12-26 DIAGNOSIS — K5730 Diverticulosis of large intestine without perforation or abscess without bleeding: Secondary | ICD-10-CM | POA: Insufficient documentation

## 2021-12-26 HISTORY — PX: COLONOSCOPY WITH PROPOFOL: SHX5780

## 2021-12-26 LAB — HM COLONOSCOPY

## 2021-12-26 SURGERY — COLONOSCOPY WITH PROPOFOL
Anesthesia: General

## 2021-12-26 MED ORDER — LACTATED RINGERS IV SOLN: INTRAVENOUS | Status: DC

## 2021-12-26 MED ORDER — PROPOFOL 500 MG/50ML IV EMUL
INTRAVENOUS | Status: DC | PRN
  Administered 2021-12-26: 160 ug/kg/min via INTRAVENOUS

## 2021-12-26 MED ORDER — LACTATED RINGERS IV SOLN
INTRAVENOUS | Status: DC | PRN
Start: 2021-12-26 — End: 2021-12-26

## 2021-12-26 MED ORDER — PROPOFOL 10 MG/ML IV BOLUS
INTRAVENOUS | Status: DC | PRN
Start: 2021-12-26 — End: 2021-12-26
  Administered 2021-12-26: 50 mg via INTRAVENOUS
  Administered 2021-12-26: 60 mg via INTRAVENOUS

## 2021-12-26 NOTE — Anesthesia Postprocedure Evaluation (Signed)
Anesthesia Post Note  Patient: [name]  Procedure(s) Performed: COLONOSCOPY WITH PROPOFOL  Patient location during evaluation: Phase II Anesthesia Type: General Level of consciousness: awake Pain management: pain level controlled Vital Signs Assessment: post-procedure vital signs reviewed and stable Respiratory status: spontaneous breathing and respiratory function stable Cardiovascular status: blood pressure returned to baseline and stable Postop Assessment: no headache and no apparent nausea or vomiting Anesthetic complications: no Comments: Late entry   No notable events documented.   Last Vitals:  Vitals:   12/26/21 1037 12/26/21 1046  BP: 101/70 111/73  Pulse: 76   Resp: 14   Temp: 36.7 C   SpO2: 99%     Last Pain:  Vitals:   12/26/21 1037  TempSrc: Oral  PainSc: 0-No pain                 Louann Sjogren

## 2021-12-26 NOTE — H&P (Signed)
[name] is an 56 y.o. female.   Chief Complaint: [name] is here for colonoscopy. HPI: [name] is 56 year old Caucasian female who is here for screening colonoscopy.  This is [name]'s first exam.  She denies change in bowel habits abdominal pain or rectal bleeding.  Family history is negative for CRC. Personal history is significant for breast carcinoma which was diagnosed in 2012.  She was stage IV.  She remains in remission.  Past Medical History:  Diagnosis Date   Breast cancer, stage 3 ([place]) 10/18/2011   Breast cancer, stage 4 ([place]) dx'd 03/12/11   left   Cancer ([place])    basal cell - skin   Hypertension    Personal history of chemotherapy    Personal history of radiation therapy    Radiation adverse effect 12/02/2011    Past Surgical History:  Procedure Laterality Date   AXILLARY LYMPH NODE DISSECTION     BREAST BIOPSY     DILATION AND CURETTAGE OF UTERUS     LIPOMA EXCISION  5 years   upper back   LYMPH NODE DISSECTION     MASTECTOMY     MASTECTOMY PARTIAL / LUMPECTOMY     PATELLA FRACTURE SURGERY Right 2014   PATELLA RECONSTRUCTION Right 2015   PORTACATH PLACEMENT     portacath removal      Family History  Problem Relation Age of Onset   Breast cancer Maternal Grandmother    Lung cancer Paternal Grandfather    Breast cancer Paternal Grandmother    Social History:  reports that she has quit smoking. She has never used smokeless tobacco. She reports current alcohol use of about 1.0 - 3.0 standard drink per week. She reports that she does not use drugs.  Allergies:  Allergies  Allergen Reactions   Bactrim Diarrhea and Nausea And Vomiting   Dilaudid [Hydromorphone Hcl] Rash   Latex Itching and Rash    Medications Prior to Admission  Medication Sig Dispense Refill   ALPRAZolam (XANAX) 1 MG tablet Take 1 tablet (1 mg total) by mouth 2 (two) times daily as needed for anxiety or sleep. ([name] taking differently: Take 1 mg by mouth 3 (three) times  daily as needed for anxiety or sleep.) 90 tablet 0   atenolol (TENORMIN) 100 MG tablet Take 100 mg by mouth daily.  3   furosemide (LASIX) 20 MG tablet Take 20 mg by mouth daily.  2   ibuprofen (ADVIL,MOTRIN) 200 MG tablet Take 200-400 mg by mouth daily as needed for headache.     oxyCODONE (OXY IR/ROXICODONE) 5 MG immediate release tablet Take 5 mg by mouth 3 (three) times daily as needed.     potassium chloride SA (KLOR-CON M) 20 MEQ tablet Take 1 tablet (20 mEq total) by mouth daily. 30 tablet 0   triamterene-hydrochlorothiazide (DYAZIDE) 37.5-25 MG per capsule Take 1 capsule by mouth every morning.       polyethylene glycol-electrolytes (TRILYTE) 420 g solution Take 4,000 mLs by mouth as directed. 4000 mL 0    Results for orders placed or performed during the hospital encounter of 12/24/21 (from the past 48 hour(s))  Basic metabolic panel     Status: Abnormal   Collection Time: 12/24/21 12:49 PM  Result Value Ref Range   Sodium 136 135 - 145 mmol/L   Potassium 3.0 (L) 3.5 - 5.1 mmol/L   Chloride 91 (L) 98 - 111 mmol/L   CO2 32 22 - 32 mmol/L   Glucose, Bld 148 (H) 70 -  99 mg/dL    Comment: Glucose reference range applies only to samples taken after fasting for at least 8 hours.   BUN 21 (H) 6 - 20 mg/dL   Creatinine, Ser 1.03 (H) 0.44 - 1.00 mg/dL   Calcium 9.7 8.9 - 10.3 mg/dL   GFR, Estimated >60 >60 mL/min    Comment: (NOTE) Calculated using the CKD-EPI Creatinine Equation (2021)    Anion gap 13 5 - 15    Comment: Performed at [facility], [address] [place], [place] [address]   No results found.  Review of Systems  Blood pressure 123/90, pulse 75, temperature 98.1 F (36.7 C), temperature source Oral, resp. rate 13, height 5\' 5"  (1.651 m), weight 68 kg, last menstrual period 07/09/2011, SpO2 100 %. Physical Exam HENT:     Mouth/Throat:     Mouth: Mucous membranes are moist.     Pharynx: Oropharynx is clear.  Eyes:     General: No scleral icterus.     Conjunctiva/sclera: Conjunctivae normal.  Cardiovascular:     Rate and Rhythm: Normal rate and regular rhythm.     Heart sounds: Normal heart sounds. No murmur heard. Pulmonary:     Effort: Pulmonary effort is normal.     Breath sounds: Normal breath sounds.  Abdominal:     General: There is no distension.     Palpations: Abdomen is soft. There is no mass.     Tenderness: There is no abdominal tenderness.  Musculoskeletal:        General: No swelling.     Cervical back: Neck supple.  Lymphadenopathy:     Cervical: No cervical adenopathy.  Skin:    General: Skin is warm and dry.  Neurological:     Mental Status: She is alert.     Assessment/Plan  Average risk screening colonoscopy.  Hildred Laser, MD 12/26/2021, 9:56 AM

## 2021-12-26 NOTE — Op Note (Signed)
[facility] Patient Name: [name] Procedure Date: 12/26/2021 9:33 AM MRN: [id number] Date of Birth: Nov 26, 1966 Attending MD: Hildred Laser , MD CSN: [id number] Age: 56 Admit Type: Outpatient Procedure:                Colonoscopy Indications:              Screening for colorectal malignant neoplasm Providers:                Hildred Laser, MD, Lambert Mody, Aram Candela Referring MD:             Delphina Cahill, MD Medicines:                Propofol per Anesthesia Complications:            No immediate complications. Estimated Blood Loss:     Estimated blood loss: none. Procedure:                Pre-Anesthesia Assessment:                           - Prior to the procedure, a History and Physical                            was performed, and patient medications and                            allergies were reviewed. The patient's tolerance of                            previous anesthesia was also reviewed. The risks                            and benefits of the procedure and the sedation                            options and risks were discussed with the patient.                            All questions were answered, and informed consent                            was obtained. Prior Anticoagulants: The patient has                            taken no previous anticoagulant or antiplatelet                            agents except for NSAID medication. ASA Grade                            Assessment: II - A patient with mild systemic                            disease. After reviewing the risks and benefits,  the patient was deemed in satisfactory condition to                            undergo the procedure.                           After obtaining informed consent, the colonoscope                            was passed under direct vision. Throughout the                            procedure, the patient's blood pressure, pulse, and                             oxygen saturations were monitored continuously. The                            [id number]-HQ190L ([id number]) scope was introduced through                            the anus and advanced to the the cecum, identified                            by appendiceal orifice and ileocecal valve. The                            colonoscopy was performed without difficulty. The                            patient tolerated the procedure well. The quality                            of the bowel preparation was good. The ileocecal                            valve, appendiceal orifice, and rectum were                            photographed. Scope In: 10:09:15 AM Scope Out: 10:25:11 AM Scope Withdrawal Time: 0 hours 6 minutes 49 seconds  Total Procedure Duration: 0 hours 15 minutes 56 seconds  Findings:      The perianal and digital rectal examinations were normal.      A few diverticula were found in the sigmoid colon.      The exam was otherwise normal throughout the examined colon.      External hemorrhoids were found during retroflexion. The hemorrhoids       were medium size. Impression:               - Diverticulosis in the sigmoid colon.                           - External hemorrhoids.                           -  No specimens collected. Moderate Sedation:      Per Anesthesia Care Recommendation:           - Patient has a contact number available for                            emergencies. The signs and symptoms of potential                            delayed complications were discussed with the                            patient. Return to normal activities tomorrow.                            Written discharge instructions were provided to the                            patient.                           - High fiber diet today.                           - Continue present medications.                           - Repeat colonoscopy in 10 years for screening                             purposes. Procedure Code(s):        --- Professional ---                           [phone], Colonoscopy, flexible; diagnostic, including                            collection of specimen(s) by brushing or washing,                            when performed (separate procedure) Diagnosis Code(s):        --- Professional ---                           Z12.11, Encounter for screening for malignant                            neoplasm of colon                           K64.9, Unspecified hemorrhoids                           K57.30, Diverticulosis of large intestine without                            perforation or abscess without bleeding CPT copyright 2019 [employer]. All  rights reserved. The codes documented in this report are preliminary and upon coder review may  be revised to meet current compliance requirements. Hildred Laser, MD Hildred Laser, MD 12/26/2021 10:33:55 AM This report has been signed electronically. Number of Addenda: 0

## 2021-12-26 NOTE — Discharge Instructions (Addendum)
Stop potassium.  Resume other medications as before. High-fiber diet. No driving for 24 hours. Next screening exam in 10 years. Will check electrolytes next week.  Office will call.

## 2021-12-26 NOTE — Transfer of Care (Signed)
Immediate Anesthesia Transfer of Care Note  Patient: [name]  Procedure(s) Performed: COLONOSCOPY WITH PROPOFOL  Patient Location: PACU  Anesthesia Type:General  Level of Consciousness: awake, alert  and oriented  Airway & Oxygen Therapy: Patient Spontanous Breathing  Post-op Assessment: Report given to RN, Post -op Vital signs reviewed and stable, Patient moving all extremities X 4 and Patient able to stick tongue midline  Post vital signs: Reviewed  Last Vitals:  Vitals Value Taken Time  BP 84/50   Temp 98.0   Pulse 76   Resp 20   SpO2 100     Last Pain:  Vitals:   12/26/21 1004  TempSrc:   PainSc: 0-No pain         Complications: No notable events documented.

## 2021-12-26 NOTE — Anesthesia Preprocedure Evaluation (Signed)
Anesthesia Evaluation  Patient identified by MRN, date of birth, ID band Patient awake    Reviewed: Allergy & Precautions, H&P , NPO status , Patient's Chart, lab work & pertinent test results, reviewed documented beta blocker date and time   Airway Mallampati: II  TM Distance: >3 FB Neck ROM: full    Dental no notable dental hx.    Pulmonary neg pulmonary ROS, former smoker,    Pulmonary exam normal breath sounds clear to auscultation       Cardiovascular Exercise Tolerance: Good hypertension, negative cardio ROS   Rhythm:regular Rate:Normal     Neuro/Psych negative neurological ROS  negative psych ROS   GI/Hepatic negative GI ROS, Neg liver ROS,   Endo/Other  negative endocrine ROS  Renal/GU negative Renal ROS  negative genitourinary   Musculoskeletal   Abdominal   Peds  Hematology negative hematology ROS (+)   Anesthesia Other Findings   Reproductive/Obstetrics negative OB ROS                             Anesthesia Physical Anesthesia Plan  ASA: 2  Anesthesia Plan: General   Post-op Pain Management:    Induction:   PONV Risk Score and Plan: Propofol infusion  Airway Management Planned:   Additional Equipment:   Intra-op Plan:   Post-operative Plan:   Informed Consent: I have reviewed the patients History and Physical, chart, labs and discussed the procedure including the risks, benefits and alternatives for the proposed anesthesia with the patient or authorized representative who has indicated his/her understanding and acceptance.     Dental Advisory Given  Plan Discussed with: CRNA  Anesthesia Plan Comments:         Anesthesia Quick Evaluation  

## 2021-12-28 ENCOUNTER — Encounter (HOSPITAL_COMMUNITY): Payer: Self-pay | Admitting: Internal Medicine

## 2022-01-10 ENCOUNTER — Telehealth (INDEPENDENT_AMBULATORY_CARE_PROVIDER_SITE_OTHER): Payer: Self-pay | Admitting: *Deleted

## 2022-01-10 NOTE — Telephone Encounter (Signed)
Patient called and wanted copy of bmp dr Laural Golden done this month to dr hall. States she is seeing him today and dr Laural Golden wanted dr hall to repeat labs. Lelon Frohlich this may have already been done but I can't tell if he copied you in result note or not) I let patient know we would send.

## 2022-01-10 NOTE — Telephone Encounter (Signed)
Labs faxed to pcp

## 2022-01-14 DIAGNOSIS — R7301 Impaired fasting glucose: Secondary | ICD-10-CM | POA: Diagnosis not present

## 2022-01-14 DIAGNOSIS — E782 Mixed hyperlipidemia: Secondary | ICD-10-CM | POA: Diagnosis not present

## 2022-01-17 DIAGNOSIS — R944 Abnormal results of kidney function studies: Secondary | ICD-10-CM | POA: Diagnosis not present

## 2022-01-17 DIAGNOSIS — Z853 Personal history of malignant neoplasm of breast: Secondary | ICD-10-CM | POA: Diagnosis not present

## 2022-01-17 DIAGNOSIS — M25562 Pain in left knee: Secondary | ICD-10-CM | POA: Diagnosis not present

## 2022-01-17 DIAGNOSIS — R945 Abnormal results of liver function studies: Secondary | ICD-10-CM | POA: Diagnosis not present

## 2022-01-17 DIAGNOSIS — R7301 Impaired fasting glucose: Secondary | ICD-10-CM | POA: Diagnosis not present

## 2022-01-17 DIAGNOSIS — E782 Mixed hyperlipidemia: Secondary | ICD-10-CM | POA: Diagnosis not present

## 2022-01-17 DIAGNOSIS — T8484XS Pain due to internal orthopedic prosthetic devices, implants and grafts, sequela: Secondary | ICD-10-CM | POA: Diagnosis not present

## 2022-01-17 DIAGNOSIS — E876 Hypokalemia: Secondary | ICD-10-CM | POA: Diagnosis not present

## 2022-01-17 DIAGNOSIS — M5416 Radiculopathy, lumbar region: Secondary | ICD-10-CM | POA: Diagnosis not present

## 2022-01-21 ENCOUNTER — Other Ambulatory Visit: Payer: Self-pay | Admitting: Internal Medicine

## 2022-01-21 DIAGNOSIS — Z87898 Personal history of other specified conditions: Secondary | ICD-10-CM

## 2022-01-21 DIAGNOSIS — Z853 Personal history of malignant neoplasm of breast: Secondary | ICD-10-CM

## 2022-02-06 ENCOUNTER — Other Ambulatory Visit (INDEPENDENT_AMBULATORY_CARE_PROVIDER_SITE_OTHER): Payer: Self-pay | Admitting: *Deleted

## 2022-02-06 MED ORDER — POTASSIUM CHLORIDE CRYS ER 20 MEQ PO TBCR: 20.0000 meq | EXTENDED_RELEASE_TABLET | Freq: Every day | ORAL | 1 refills | Status: AC

## 2022-02-06 NOTE — Progress Notes (Signed)
Fax from [facility] for potassium. Potassium not on currently med list. Called pt she states potassium was low on pre op bw. And dr Laural Golden sent in potassium. Last labs on 12/24/21 potassium was 3.0. she states she was taking 20 meq daily. States she is suppose to have bw with pcp in 3 months. Per dr Laural Golden - may send in potassium 20 meq one daily #30 with one refill. Pt to check with dr Nevada Crane after taht to see if she needs to continue on potassium.  I called and discussed with pt and she verbalized understanding and med was sent to pharm.

## 2022-02-07 ENCOUNTER — Telehealth (INDEPENDENT_AMBULATORY_CARE_PROVIDER_SITE_OTHER): Payer: Self-pay | Admitting: *Deleted

## 2022-02-07 NOTE — Telephone Encounter (Signed)
Fax from [facility] for medical clarification. Dr Laural Golden prescribed klor -con when potassium was low at pre op for colonoscopy. Pt is taking triamt/hctz prescribed by pcp dr hall. Fax from [facility] for clarification. Per manufacturer, taking klor-con 20 er tab 71meq and triam/hctz 37.5mg  is contraindicated. Patient states dr hall recently checked potassium and states it was low. Per dr Laural Golden patient needs to discuss with dr [place] Crane or either have potassium rechecked by dr Laural Golden. Patient states to send fax to Dr. Nevada Crane and see what he recommends.  Faxed form to Dr. Nevada Crane with this note.

## 2022-04-08 ENCOUNTER — Other Ambulatory Visit: Payer: Self-pay | Admitting: Internal Medicine

## 2022-04-08 DIAGNOSIS — Z1231 Encounter for screening mammogram for malignant neoplasm of breast: Secondary | ICD-10-CM

## 2022-04-23 ENCOUNTER — Other Ambulatory Visit: Payer: Medicare Other

## 2022-04-23 ENCOUNTER — Ambulatory Visit: Payer: Medicare Other

## 2022-04-30 ENCOUNTER — Other Ambulatory Visit: Payer: Self-pay | Admitting: Internal Medicine

## 2022-05-02 DIAGNOSIS — E782 Mixed hyperlipidemia: Secondary | ICD-10-CM | POA: Diagnosis not present

## 2022-05-02 DIAGNOSIS — R7301 Impaired fasting glucose: Secondary | ICD-10-CM | POA: Diagnosis not present

## 2022-05-03 ENCOUNTER — Ambulatory Visit
Admission: RE | Admit: 2022-05-03 | Discharge: 2022-05-03 | Disposition: A | Discharge status: Home / Self Care | Payer: Medicare Other | Source: Ambulatory Visit | Attending: Internal Medicine | Admitting: Internal Medicine

## 2022-05-03 ENCOUNTER — Other Ambulatory Visit: Payer: Medicare Other

## 2022-05-03 DIAGNOSIS — Z87898 Personal history of other specified conditions: Secondary | ICD-10-CM

## 2022-05-03 DIAGNOSIS — Z1231 Encounter for screening mammogram for malignant neoplasm of breast: Secondary | ICD-10-CM

## 2022-05-03 DIAGNOSIS — Z853 Personal history of malignant neoplasm of breast: Secondary | ICD-10-CM | POA: Diagnosis not present

## 2022-05-03 DIAGNOSIS — Z9889 Other specified postprocedural states: Secondary | ICD-10-CM | POA: Diagnosis not present

## 2022-05-03 MED ORDER — GADOBUTROL 1 MMOL/ML IV SOLN
7.0000 mL | Freq: Once | INTRAVENOUS | Status: AC | PRN
  Administered 2022-05-03: 7 mL via INTRAVENOUS

## 2022-05-10 ENCOUNTER — Other Ambulatory Visit: Payer: Medicare Other

## 2022-07-25 DIAGNOSIS — D1801 Hemangioma of skin and subcutaneous tissue: Secondary | ICD-10-CM | POA: Diagnosis not present

## 2022-07-25 DIAGNOSIS — L309 Dermatitis, unspecified: Secondary | ICD-10-CM | POA: Diagnosis not present

## 2022-07-25 DIAGNOSIS — L570 Actinic keratosis: Secondary | ICD-10-CM | POA: Diagnosis not present

## 2022-07-25 DIAGNOSIS — Z85828 Personal history of other malignant neoplasm of skin: Secondary | ICD-10-CM | POA: Diagnosis not present

## 2022-07-25 DIAGNOSIS — L821 Other seborrheic keratosis: Secondary | ICD-10-CM | POA: Diagnosis not present

## 2022-08-21 ENCOUNTER — Telehealth: Payer: Self-pay | Admitting: Cardiology

## 2022-08-21 NOTE — Telephone Encounter (Signed)
Patient walked into the [place] office today with a bill from [employer]. She was marked as a NS for a visit of 06-04-21. She had called someone the day before to cancel the appointment but it was not cancelled.  She has received a letter from [employer] for a $50.00 no show fee.  Is there anyway this can be marked off since patient states that she call and spoke with someone to cancel the appointment. Spoke with Lucy Chris and she advised that it be turned over to our manager.

## 2022-09-26 DIAGNOSIS — I10 Essential (primary) hypertension: Secondary | ICD-10-CM | POA: Diagnosis not present

## 2022-09-26 DIAGNOSIS — R7301 Impaired fasting glucose: Secondary | ICD-10-CM | POA: Diagnosis not present

## 2022-10-03 DIAGNOSIS — Z0000 Encounter for general adult medical examination without abnormal findings: Secondary | ICD-10-CM | POA: Diagnosis not present

## 2022-10-03 DIAGNOSIS — R945 Abnormal results of liver function studies: Secondary | ICD-10-CM | POA: Diagnosis not present

## 2022-10-03 DIAGNOSIS — T8484XS Pain due to internal orthopedic prosthetic devices, implants and grafts, sequela: Secondary | ICD-10-CM | POA: Diagnosis not present

## 2022-10-03 DIAGNOSIS — M25562 Pain in left knee: Secondary | ICD-10-CM | POA: Diagnosis not present

## 2022-10-03 DIAGNOSIS — E782 Mixed hyperlipidemia: Secondary | ICD-10-CM | POA: Diagnosis not present

## 2022-10-03 DIAGNOSIS — R2689 Other abnormalities of gait and mobility: Secondary | ICD-10-CM | POA: Diagnosis not present

## 2022-10-03 DIAGNOSIS — M5416 Radiculopathy, lumbar region: Secondary | ICD-10-CM | POA: Diagnosis not present

## 2022-10-03 DIAGNOSIS — R5383 Other fatigue: Secondary | ICD-10-CM | POA: Diagnosis not present

## 2022-10-03 DIAGNOSIS — E876 Hypokalemia: Secondary | ICD-10-CM | POA: Diagnosis not present

## 2022-10-03 DIAGNOSIS — R7301 Impaired fasting glucose: Secondary | ICD-10-CM | POA: Diagnosis not present

## 2022-10-03 DIAGNOSIS — R944 Abnormal results of kidney function studies: Secondary | ICD-10-CM | POA: Diagnosis not present

## 2022-10-03 DIAGNOSIS — I10 Essential (primary) hypertension: Secondary | ICD-10-CM | POA: Diagnosis not present

## 2022-10-03 DIAGNOSIS — Z853 Personal history of malignant neoplasm of breast: Secondary | ICD-10-CM | POA: Diagnosis not present

## 2023-03-28 DIAGNOSIS — R7301 Impaired fasting glucose: Secondary | ICD-10-CM | POA: Diagnosis not present

## 2023-03-28 DIAGNOSIS — E782 Mixed hyperlipidemia: Secondary | ICD-10-CM | POA: Diagnosis not present

## 2023-04-04 ENCOUNTER — Other Ambulatory Visit (HOSPITAL_COMMUNITY): Payer: Self-pay | Admitting: Internal Medicine

## 2023-04-04 DIAGNOSIS — E782 Mixed hyperlipidemia: Secondary | ICD-10-CM | POA: Diagnosis not present

## 2023-04-04 DIAGNOSIS — E876 Hypokalemia: Secondary | ICD-10-CM | POA: Diagnosis not present

## 2023-04-04 DIAGNOSIS — R2689 Other abnormalities of gait and mobility: Secondary | ICD-10-CM | POA: Diagnosis not present

## 2023-04-04 DIAGNOSIS — M5416 Radiculopathy, lumbar region: Secondary | ICD-10-CM | POA: Diagnosis not present

## 2023-04-04 DIAGNOSIS — R944 Abnormal results of kidney function studies: Secondary | ICD-10-CM | POA: Diagnosis not present

## 2023-04-04 DIAGNOSIS — R7401 Elevation of levels of liver transaminase levels: Secondary | ICD-10-CM | POA: Diagnosis not present

## 2023-04-04 DIAGNOSIS — R7301 Impaired fasting glucose: Secondary | ICD-10-CM | POA: Diagnosis not present

## 2023-04-04 DIAGNOSIS — T8484XS Pain due to internal orthopedic prosthetic devices, implants and grafts, sequela: Secondary | ICD-10-CM | POA: Diagnosis not present

## 2023-04-04 DIAGNOSIS — I10 Essential (primary) hypertension: Secondary | ICD-10-CM | POA: Diagnosis not present

## 2023-04-04 DIAGNOSIS — M722 Plantar fascial fibromatosis: Secondary | ICD-10-CM | POA: Diagnosis not present

## 2023-05-09 ENCOUNTER — Ambulatory Visit (HOSPITAL_BASED_OUTPATIENT_CLINIC_OR_DEPARTMENT_OTHER)
Admission: RE | Admit: 2023-05-09 | Discharge: 2023-05-09 | Disposition: A | Discharge status: Home / Self Care | Payer: Medicare Other | Source: Ambulatory Visit | Attending: Internal Medicine | Admitting: Internal Medicine

## 2023-05-09 DIAGNOSIS — E782 Mixed hyperlipidemia: Secondary | ICD-10-CM | POA: Insufficient documentation

## 2023-06-23 DIAGNOSIS — D485 Neoplasm of uncertain behavior of skin: Secondary | ICD-10-CM | POA: Diagnosis not present

## 2023-06-23 DIAGNOSIS — Z85828 Personal history of other malignant neoplasm of skin: Secondary | ICD-10-CM | POA: Diagnosis not present

## 2023-06-23 DIAGNOSIS — L309 Dermatitis, unspecified: Secondary | ICD-10-CM | POA: Diagnosis not present

## 2023-06-23 DIAGNOSIS — L570 Actinic keratosis: Secondary | ICD-10-CM | POA: Diagnosis not present

## 2023-06-23 DIAGNOSIS — L821 Other seborrheic keratosis: Secondary | ICD-10-CM | POA: Diagnosis not present

## 2023-06-23 DIAGNOSIS — D0462 Carcinoma in situ of skin of left upper limb, including shoulder: Secondary | ICD-10-CM | POA: Diagnosis not present

## 2023-07-03 DIAGNOSIS — E876 Hypokalemia: Secondary | ICD-10-CM | POA: Diagnosis not present

## 2023-07-03 DIAGNOSIS — Z79899 Other long term (current) drug therapy: Secondary | ICD-10-CM | POA: Diagnosis not present

## 2023-07-03 DIAGNOSIS — Z6824 Body mass index (BMI) 24.0-24.9, adult: Secondary | ICD-10-CM | POA: Diagnosis not present

## 2023-07-03 DIAGNOSIS — Z713 Dietary counseling and surveillance: Secondary | ICD-10-CM | POA: Diagnosis not present

## 2023-07-03 DIAGNOSIS — E782 Mixed hyperlipidemia: Secondary | ICD-10-CM | POA: Diagnosis not present

## 2023-07-03 DIAGNOSIS — I10 Essential (primary) hypertension: Secondary | ICD-10-CM | POA: Diagnosis not present

## 2023-07-03 DIAGNOSIS — M5416 Radiculopathy, lumbar region: Secondary | ICD-10-CM | POA: Diagnosis not present

## 2023-07-03 DIAGNOSIS — G8928 Other chronic postprocedural pain: Secondary | ICD-10-CM | POA: Diagnosis not present

## 2023-07-03 DIAGNOSIS — I700 Atherosclerosis of aorta: Secondary | ICD-10-CM | POA: Diagnosis not present

## 2023-07-03 DIAGNOSIS — T8484XS Pain due to internal orthopedic prosthetic devices, implants and grafts, sequela: Secondary | ICD-10-CM | POA: Diagnosis not present

## 2023-09-26 DIAGNOSIS — R7301 Impaired fasting glucose: Secondary | ICD-10-CM | POA: Diagnosis not present

## 2023-09-26 DIAGNOSIS — E782 Mixed hyperlipidemia: Secondary | ICD-10-CM | POA: Diagnosis not present

## 2023-10-02 DIAGNOSIS — E782 Mixed hyperlipidemia: Secondary | ICD-10-CM | POA: Diagnosis not present

## 2023-10-02 DIAGNOSIS — R944 Abnormal results of kidney function studies: Secondary | ICD-10-CM | POA: Diagnosis not present

## 2023-10-02 DIAGNOSIS — R945 Abnormal results of liver function studies: Secondary | ICD-10-CM | POA: Diagnosis not present

## 2023-10-02 DIAGNOSIS — I10 Essential (primary) hypertension: Secondary | ICD-10-CM | POA: Diagnosis not present

## 2023-10-02 DIAGNOSIS — E876 Hypokalemia: Secondary | ICD-10-CM | POA: Diagnosis not present

## 2023-10-02 DIAGNOSIS — Z853 Personal history of malignant neoplasm of breast: Secondary | ICD-10-CM | POA: Diagnosis not present

## 2023-10-02 DIAGNOSIS — R7303 Prediabetes: Secondary | ICD-10-CM | POA: Diagnosis not present

## 2023-10-02 DIAGNOSIS — M5416 Radiculopathy, lumbar region: Secondary | ICD-10-CM | POA: Diagnosis not present

## 2023-10-02 DIAGNOSIS — I700 Atherosclerosis of aorta: Secondary | ICD-10-CM | POA: Diagnosis not present

## 2023-10-02 DIAGNOSIS — T8484XS Pain due to internal orthopedic prosthetic devices, implants and grafts, sequela: Secondary | ICD-10-CM | POA: Diagnosis not present

## 2024-03-26 DIAGNOSIS — R5383 Other fatigue: Secondary | ICD-10-CM | POA: Diagnosis not present

## 2024-03-26 DIAGNOSIS — R7303 Prediabetes: Secondary | ICD-10-CM | POA: Diagnosis not present

## 2024-03-26 DIAGNOSIS — E782 Mixed hyperlipidemia: Secondary | ICD-10-CM | POA: Diagnosis not present

## 2024-04-20 DIAGNOSIS — R7303 Prediabetes: Secondary | ICD-10-CM | POA: Diagnosis not present

## 2024-04-20 DIAGNOSIS — R945 Abnormal results of liver function studies: Secondary | ICD-10-CM | POA: Diagnosis not present

## 2024-04-20 DIAGNOSIS — E876 Hypokalemia: Secondary | ICD-10-CM | POA: Diagnosis not present

## 2024-04-20 DIAGNOSIS — T8484XD Pain due to internal orthopedic prosthetic devices, implants and grafts, subsequent encounter: Secondary | ICD-10-CM | POA: Diagnosis not present

## 2024-04-20 DIAGNOSIS — Z853 Personal history of malignant neoplasm of breast: Secondary | ICD-10-CM | POA: Diagnosis not present

## 2024-04-20 DIAGNOSIS — R944 Abnormal results of kidney function studies: Secondary | ICD-10-CM | POA: Diagnosis not present

## 2024-04-20 DIAGNOSIS — M5416 Radiculopathy, lumbar region: Secondary | ICD-10-CM | POA: Diagnosis not present

## 2024-04-20 DIAGNOSIS — I700 Atherosclerosis of aorta: Secondary | ICD-10-CM | POA: Diagnosis not present

## 2024-04-20 DIAGNOSIS — I10 Essential (primary) hypertension: Secondary | ICD-10-CM | POA: Diagnosis not present

## 2024-04-20 DIAGNOSIS — E782 Mixed hyperlipidemia: Secondary | ICD-10-CM | POA: Diagnosis not present

## 2024-05-19 DIAGNOSIS — R482 Apraxia: Secondary | ICD-10-CM | POA: Diagnosis not present

## 2024-05-19 DIAGNOSIS — H04123 Dry eye syndrome of bilateral lacrimal glands: Secondary | ICD-10-CM | POA: Diagnosis not present

## 2024-05-19 DIAGNOSIS — H25813 Combined forms of age-related cataract, bilateral: Secondary | ICD-10-CM | POA: Diagnosis not present

## 2024-05-19 DIAGNOSIS — H02831 Dermatochalasis of right upper eyelid: Secondary | ICD-10-CM | POA: Diagnosis not present

## 2024-05-27 ENCOUNTER — Other Ambulatory Visit: Payer: Self-pay | Admitting: Ophthalmology

## 2024-05-27 DIAGNOSIS — R482 Apraxia: Secondary | ICD-10-CM

## 2024-06-22 DIAGNOSIS — Z8719 Personal history of other diseases of the digestive system: Secondary | ICD-10-CM | POA: Diagnosis not present

## 2024-06-22 DIAGNOSIS — R1031 Right lower quadrant pain: Secondary | ICD-10-CM | POA: Diagnosis not present

## 2024-06-22 DIAGNOSIS — R109 Unspecified abdominal pain: Secondary | ICD-10-CM | POA: Diagnosis not present

## 2024-06-22 DIAGNOSIS — R1032 Left lower quadrant pain: Secondary | ICD-10-CM | POA: Diagnosis not present

## 2024-07-26 ENCOUNTER — Ambulatory Visit
Admission: RE | Admit: 2024-07-26 | Discharge: 2024-07-26 | Disposition: A | Discharge status: Home / Self Care | Source: Ambulatory Visit | Attending: Ophthalmology | Admitting: Ophthalmology

## 2024-07-26 DIAGNOSIS — R482 Apraxia: Secondary | ICD-10-CM | POA: Diagnosis not present

## 2024-07-26 DIAGNOSIS — R29810 Facial weakness: Secondary | ICD-10-CM | POA: Diagnosis not present

## 2024-07-26 MED ORDER — GADOPICLENOL 0.5 MMOL/ML IV SOLN
7.0000 mL | Freq: Once | INTRAVENOUS | Status: AC | PRN
  Administered 2024-07-26 (×2): 7 mL via INTRAVENOUS

## 2024-09-22 ENCOUNTER — Ambulatory Visit (HOSPITAL_BASED_OUTPATIENT_CLINIC_OR_DEPARTMENT_OTHER): Admitting: Orthopaedic Surgery

## 2024-09-22 ENCOUNTER — Ambulatory Visit (HOSPITAL_BASED_OUTPATIENT_CLINIC_OR_DEPARTMENT_OTHER)

## 2024-09-22 DIAGNOSIS — M25531 Pain in right wrist: Secondary | ICD-10-CM

## 2024-09-22 DIAGNOSIS — M25539 Pain in unspecified wrist: Secondary | ICD-10-CM | POA: Diagnosis not present

## 2024-09-22 DIAGNOSIS — M7989 Other specified soft tissue disorders: Secondary | ICD-10-CM | POA: Diagnosis not present

## 2024-09-22 DIAGNOSIS — M1810 Unilateral primary osteoarthritis of first carpometacarpal joint, unspecified hand: Secondary | ICD-10-CM | POA: Diagnosis not present

## 2024-09-22 NOTE — Progress Notes (Signed)
 Chief Complaint: Right wrist mass     History of Present Illness:    [name] is a 58 y.o. female presents with a right wrist mass over the first dorsal compartment of the right hand.  She states that this has not been necessarily increasing in size but is firm and bothersome to her in terms of bending of the wrist.  She does feel like she has decreased grip strength although denies pain directly over the [facility] joint.    PMH/PSH/Family History/Social History/Meds/Allergies:    Past Medical History:  Diagnosis Date   Breast cancer, stage 3 (HCC) 10/18/2011   Breast cancer, stage 4 (HCC) dx'd 03/12/11   left   Cancer (HCC)    basal cell - skin   Hypertension    Personal history of chemotherapy    Personal history of radiation therapy    Radiation adverse effect 12/02/2011   Past Surgical History:  Procedure Laterality Date   AXILLARY LYMPH NODE DISSECTION     BREAST BIOPSY     COLONOSCOPY WITH PROPOFOL  N/A 12/26/2021   Procedure: COLONOSCOPY WITH PROPOFOL ;  Surgeon: Golda Claudis PENNER, MD;  Location: [facility];  Service: Endoscopy;  Laterality: N/A;  9:45   DILATION AND CURETTAGE OF UTERUS     LIPOMA EXCISION  5 years   upper back   LYMPH NODE DISSECTION     MASTECTOMY     MASTECTOMY PARTIAL / LUMPECTOMY     PATELLA FRACTURE SURGERY Right 2014   PATELLA RECONSTRUCTION Right 2015   PORTACATH PLACEMENT     portacath removal     Social History   Socioeconomic History   Marital status: Married    Spouse name: Not on file   Number of children: Not on file   Years of education: Not on file   Highest education level: Not on file  Occupational History   Not on file  Tobacco Use   Smoking status: Former   Smokeless tobacco: Never   Tobacco comments:    quit 5 yrs ago  Substance and Sexual Activity   Alcohol use: Yes    Alcohol/week: 1.0 - 3.0 standard drink of alcohol    Types: 1 - 3 drink(s) per week    Comment: occasionally   Drug use: No    Sexual activity: Yes  Other Topics Concern   Not on file  Social History Narrative   Not on file   Social Drivers of Health   Financial Resource Strain: Not on file  Food Insecurity: Not on file  Transportation Needs: Not on file  Physical Activity: Not on file  Stress: Not on file  Social Connections: Not on file   Family History  Problem Relation Age of Onset   Breast cancer Maternal Grandmother    Lung cancer Paternal Grandfather    Breast cancer Paternal Grandmother    Allergies  Allergen Reactions   Bactrim Diarrhea and Nausea And Vomiting   Dilaudid [Hydromorphone Hcl] Rash   Latex Itching and Rash   Current Outpatient Medications  Medication Sig Dispense Refill   ALPRAZolam  (XANAX ) 1 MG tablet Take 1 tablet (1 mg total) by mouth 2 (two) times daily as needed for anxiety or sleep. (Patient taking differently: Take 1 mg by mouth 3 (three) times daily as needed for anxiety or sleep.) 90 tablet 0   atenolol (TENORMIN) 100 MG tablet Take 100 mg by mouth daily.  3   furosemide (LASIX) 20 MG tablet Take 20 mg by  mouth daily.  2   ibuprofen  (ADVIL ,MOTRIN ) 200 MG tablet Take 200-400 mg by mouth daily as needed for headache.     oxyCODONE  (OXY IR/ROXICODONE ) 5 MG immediate release tablet Take 5 mg by mouth 3 (three) times daily as needed.     potassium chloride  SA (KLOR-CON  M) 20 MEQ tablet Take 1 tablet (20 mEq total) by mouth daily. 30 tablet 1   triamterene-hydrochlorothiazide (DYAZIDE) 37.5-25 MG per capsule Take 1 capsule by mouth every morning.       No current facility-administered medications for this visit.   No results found.  Review of Systems:   A ROS was performed including pertinent positives and negatives as documented in the HPI.  Physical Exam :   Constitutional: NAD and appears stated age Neurological: Alert and oriented Psych: Appropriate affect and cooperative Last menstrual period 07/09/2011.   Comprehensive Musculoskeletal Exam:    Right first  dorsal compartment does have significant tenosynovitis which is firm.  Positive Terrilee otherwise normal exam with negative CMC grind   Imaging:   Xray (3 view right wrist): Mild CMC arthritis    I personally reviewed and interpreted the radiographs.   Assessment and Plan:   58 y.o. female with evidence of a right first compartment tenosynovitis that has formed a palpable not enlarging mass.  At today's visit I did recommend a referral to Dr. Arlinda as this has been quite bothersome to her in terms of wrist movement I think she would benefit from removal.  I did discuss that this does not appear to be a typical ganglion that could benefit from an aspiration   I personally saw and evaluated the patient, and participated in the management and treatment plan.  Elspeth Parker, MD Attending Physician, Orthopedic Surgery  This document was dictated using Dragon voice recognition software. A reasonable attempt at proof reading has been made to minimize errors.

## 2024-09-23 ENCOUNTER — Telehealth: Payer: Self-pay

## 2024-09-23 NOTE — Telephone Encounter (Signed)
 Called pt to have her schedule with the first available with Dr. Erwin.

## 2024-10-07 ENCOUNTER — Ambulatory Visit (HOSPITAL_BASED_OUTPATIENT_CLINIC_OR_DEPARTMENT_OTHER): Admitting: Orthopaedic Surgery

## 2024-10-18 ENCOUNTER — Encounter: Payer: Self-pay | Admitting: Radiology

## 2024-11-30 ENCOUNTER — Other Ambulatory Visit: Payer: Self-pay | Admitting: Internal Medicine

## 2024-11-30 DIAGNOSIS — Z853 Personal history of malignant neoplasm of breast: Secondary | ICD-10-CM

## 2025-01-06 ENCOUNTER — Ambulatory Visit
Admission: RE | Admit: 2025-01-06 | Discharge: 2025-01-06 | Disposition: A | Discharge status: Home / Self Care | Source: Ambulatory Visit | Attending: Internal Medicine | Admitting: Internal Medicine

## 2025-01-06 DIAGNOSIS — Z853 Personal history of malignant neoplasm of breast: Secondary | ICD-10-CM

## 2025-01-06 MED ORDER — GADOPICLENOL 0.5 MMOL/ML IV SOLN
7.0000 mL | Freq: Once | INTRAVENOUS | Status: AC | PRN
  Administered 2025-01-06: 7 mL via INTRAVENOUS
